# Patient Record
Sex: Female | Born: 1955 | Race: White | Hispanic: No | Marital: Single
Health system: Southern US, Community
[De-identification: ages and names within clinical notes are randomized; demographics above are authoritative.]

## PROBLEM LIST (undated history)

## (undated) DIAGNOSIS — J45909 Unspecified asthma, uncomplicated: Secondary | ICD-10-CM

## (undated) HISTORY — DX: Unspecified asthma, uncomplicated: J45.909

---

## 1999-01-28 ENCOUNTER — Ambulatory Visit (HOSPITAL_COMMUNITY): Admission: RE | Admit: 1999-01-28 | Discharge: 1999-01-28 | Payer: Self-pay | Admitting: *Deleted

## 1999-08-15 ENCOUNTER — Other Ambulatory Visit: Admission: RE | Admit: 1999-08-15 | Discharge: 1999-08-15 | Payer: Self-pay | Admitting: Family Medicine

## 1999-09-20 ENCOUNTER — Encounter: Payer: Self-pay | Admitting: Family Medicine

## 1999-09-20 ENCOUNTER — Encounter: Admission: RE | Admit: 1999-09-20 | Discharge: 1999-09-20 | Payer: Self-pay | Admitting: Family Medicine

## 2000-10-18 ENCOUNTER — Encounter: Admission: RE | Admit: 2000-10-18 | Discharge: 2000-10-18 | Payer: Self-pay | Admitting: Family Medicine

## 2000-10-18 ENCOUNTER — Encounter: Payer: Self-pay | Admitting: Family Medicine

## 2002-06-09 ENCOUNTER — Other Ambulatory Visit: Admission: RE | Admit: 2002-06-09 | Discharge: 2002-06-09 | Payer: Self-pay | Admitting: *Deleted

## 2002-12-23 ENCOUNTER — Encounter: Admission: RE | Admit: 2002-12-23 | Discharge: 2002-12-23 | Payer: Self-pay | Admitting: Family Medicine

## 2002-12-23 ENCOUNTER — Encounter: Payer: Self-pay | Admitting: Family Medicine

## 2004-08-31 ENCOUNTER — Other Ambulatory Visit: Admission: RE | Admit: 2004-08-31 | Discharge: 2004-08-31 | Payer: Self-pay | Admitting: Family Medicine

## 2005-09-21 ENCOUNTER — Other Ambulatory Visit: Admission: RE | Admit: 2005-09-21 | Discharge: 2005-09-21 | Payer: Self-pay | Admitting: Family Medicine

## 2005-09-28 ENCOUNTER — Encounter: Admission: RE | Admit: 2005-09-28 | Discharge: 2005-09-28 | Payer: Self-pay | Admitting: Family Medicine

## 2006-10-02 ENCOUNTER — Encounter: Admission: RE | Admit: 2006-10-02 | Discharge: 2006-10-02 | Payer: Self-pay | Admitting: Family Medicine

## 2007-09-30 ENCOUNTER — Encounter: Admission: RE | Admit: 2007-09-30 | Discharge: 2007-09-30 | Payer: Self-pay | Admitting: Family Medicine

## 2007-10-04 ENCOUNTER — Encounter: Admission: RE | Admit: 2007-10-04 | Discharge: 2007-10-04 | Payer: Self-pay | Admitting: Family Medicine

## 2008-10-07 ENCOUNTER — Encounter: Admission: RE | Admit: 2008-10-07 | Discharge: 2008-10-07 | Payer: Self-pay | Admitting: Family Medicine

## 2009-11-18 ENCOUNTER — Encounter: Admission: RE | Admit: 2009-11-18 | Discharge: 2009-11-18 | Payer: Self-pay | Admitting: Family Medicine

## 2010-11-18 ENCOUNTER — Other Ambulatory Visit: Payer: Self-pay | Admitting: Family Medicine

## 2010-11-18 ENCOUNTER — Ambulatory Visit
Admission: RE | Admit: 2010-11-18 | Discharge: 2010-11-18 | Disposition: A | Discharge status: Home / Self Care | Payer: 59 | Source: Ambulatory Visit | Attending: Family Medicine | Admitting: Family Medicine

## 2010-11-18 DIAGNOSIS — Z1231 Encounter for screening mammogram for malignant neoplasm of breast: Secondary | ICD-10-CM

## 2010-11-18 DIAGNOSIS — R059 Cough, unspecified: Secondary | ICD-10-CM

## 2010-11-18 DIAGNOSIS — R05 Cough: Secondary | ICD-10-CM

## 2010-11-24 ENCOUNTER — Ambulatory Visit
Admission: RE | Admit: 2010-11-24 | Discharge: 2010-11-24 | Disposition: A | Discharge status: Home / Self Care | Payer: 59 | Source: Ambulatory Visit | Attending: Family Medicine | Admitting: Family Medicine

## 2010-11-24 DIAGNOSIS — Z1231 Encounter for screening mammogram for malignant neoplasm of breast: Secondary | ICD-10-CM

## 2011-11-16 ENCOUNTER — Other Ambulatory Visit: Payer: Self-pay | Admitting: Family Medicine

## 2011-11-16 DIAGNOSIS — Z1231 Encounter for screening mammogram for malignant neoplasm of breast: Secondary | ICD-10-CM

## 2011-12-06 ENCOUNTER — Ambulatory Visit
Admission: RE | Admit: 2011-12-06 | Discharge: 2011-12-06 | Disposition: A | Discharge status: Home / Self Care | Payer: 59 | Source: Ambulatory Visit | Attending: Family Medicine | Admitting: Family Medicine

## 2011-12-06 DIAGNOSIS — Z1231 Encounter for screening mammogram for malignant neoplasm of breast: Secondary | ICD-10-CM

## 2012-11-19 ENCOUNTER — Other Ambulatory Visit: Payer: Self-pay

## 2012-11-19 DIAGNOSIS — Z1231 Encounter for screening mammogram for malignant neoplasm of breast: Secondary | ICD-10-CM

## 2012-12-19 ENCOUNTER — Ambulatory Visit
Admission: RE | Admit: 2012-12-19 | Discharge: 2012-12-19 | Disposition: A | Discharge status: Home / Self Care | Payer: 59 | Source: Ambulatory Visit

## 2012-12-19 DIAGNOSIS — Z1231 Encounter for screening mammogram for malignant neoplasm of breast: Secondary | ICD-10-CM

## 2013-10-21 ENCOUNTER — Other Ambulatory Visit: Payer: Self-pay

## 2013-10-21 DIAGNOSIS — Z1231 Encounter for screening mammogram for malignant neoplasm of breast: Secondary | ICD-10-CM

## 2013-12-24 ENCOUNTER — Ambulatory Visit
Admission: RE | Admit: 2013-12-24 | Discharge: 2013-12-24 | Disposition: A | Discharge status: Home / Self Care | Payer: 59 | Source: Ambulatory Visit

## 2013-12-24 DIAGNOSIS — Z1231 Encounter for screening mammogram for malignant neoplasm of breast: Secondary | ICD-10-CM

## 2014-11-24 ENCOUNTER — Other Ambulatory Visit: Payer: Self-pay

## 2014-11-24 DIAGNOSIS — Z1231 Encounter for screening mammogram for malignant neoplasm of breast: Secondary | ICD-10-CM

## 2014-12-30 ENCOUNTER — Ambulatory Visit
Admission: RE | Admit: 2014-12-30 | Discharge: 2014-12-30 | Disposition: A | Discharge status: Home / Self Care | Payer: 59 | Source: Ambulatory Visit

## 2014-12-30 DIAGNOSIS — Z1231 Encounter for screening mammogram for malignant neoplasm of breast: Secondary | ICD-10-CM

## 2015-04-21 ENCOUNTER — Institutional Professional Consult (permissible substitution): Payer: 59 | Admitting: Pulmonary Disease

## 2015-05-19 ENCOUNTER — Encounter (INDEPENDENT_AMBULATORY_CARE_PROVIDER_SITE_OTHER): Payer: Self-pay

## 2015-05-19 ENCOUNTER — Encounter: Payer: Self-pay | Admitting: Pulmonary Disease

## 2015-05-19 ENCOUNTER — Ambulatory Visit (INDEPENDENT_AMBULATORY_CARE_PROVIDER_SITE_OTHER)
Admission: RE | Admit: 2015-05-19 | Discharge: 2015-05-19 | Disposition: A | Discharge status: Home / Self Care | Payer: 59 | Source: Ambulatory Visit | Attending: Pulmonary Disease | Admitting: Pulmonary Disease

## 2015-05-19 ENCOUNTER — Ambulatory Visit (INDEPENDENT_AMBULATORY_CARE_PROVIDER_SITE_OTHER): Payer: 59 | Admitting: Pulmonary Disease

## 2015-05-19 VITALS — BP 106/64 | HR 82 | Temp 97.2°F | Ht 66.0 in | Wt 122.6 lb

## 2015-05-19 DIAGNOSIS — J4530 Mild persistent asthma, uncomplicated: Secondary | ICD-10-CM

## 2015-05-19 DIAGNOSIS — M419 Scoliosis, unspecified: Secondary | ICD-10-CM

## 2015-05-19 DIAGNOSIS — R0600 Dyspnea, unspecified: Secondary | ICD-10-CM | POA: Diagnosis not present

## 2015-05-19 DIAGNOSIS — J45909 Unspecified asthma, uncomplicated: Secondary | ICD-10-CM | POA: Insufficient documentation

## 2015-05-19 MED ORDER — CLONAZEPAM 0.5 MG PO TABS: ORAL_TABLET | ORAL | Status: DC

## 2015-05-19 NOTE — Progress Notes (Addendum)
Subjective:     Patient ID: Wendy [name], female   DOB: [birth year], 59 y.o.   MRN: [id number]  HPI 59 y/o WF, referred by Dr. Kelton Pillar, Sadie Haber at [facility], for a pulmonary evaluation due to chest tightness and a hx of asthma>      Wendy [name] tells me that she has had Asthma ever since she was about 59 y/o when she had a bout of double pneumonia & told she had Asthma after that; as a child she recalls ER visits and shots w/ one other hosp around age 39 due to an asthma attack; she never used any inhalers until her teenage yrs when she started using OTC Primateen... In her adult life she notes occas episodes of bronchitis requiring antibiotics, and was started on Advair100 approx 10 yrs ago- initially used just one inhalation daily & recently incr to one inhalation Bid due to increased difficulty breathing & having to use her rescue inhaler (Proair) more often... She describes chest tightness ans some intermittent right chest discomfort but she actually denies wheezing, cough, sputum, hemoptysis, f/c/s, sinus symptoms, reflux, etc; her sensation of SOB is hard for her to characterize but she feels that it's more difficult to get the air "IN", not satisfied breathing, "not enough room for the air in her lungs"; she is active & walks ~57min most days, denies DOE, does some light yard work but [employer], denies any progressive DOE...       She is a never smoker but was exposed to second hand smoke from her parents- Father died from small cell ca, and Mother has COPD & CHF; she believes that her paternal grandfather had silicosis (she says he was a well digger); she is employed in the IT Dept at [employer] denies any known occupational exposures over he lifetime...      She has a scratchy voice which she says occurs most mornings but denies sore throat, reflux symptoms, cough at night, hx of HH etc; she sleeps well, wakes refreshed, but enjoys an afternoon nap & gets tired by 8PM; she denies being told about  snoring, restless sleeping, etc; she denies any bad allergies and has a dog (scottish terrier) and 2 cats...  EXAM reveals Afeb, VSS, O2sat=95% on RA;  HEENT- neg, mallampati1;  Chest- clear w/o w/r/r;  Heart- RR w/o m/r/g;  Abd- soft, nontender, neg;  Ext- neg w/o c/c/e;  Neuro- intact...  CXR 05/19/15 showed norm heart size, clear lungs, scoliosis, NAD....   Spirometry 05/19/15 showed FVC=3.3 (94%), FEV1=2.6 (92%), %1sec=76, mid-flows are wnl at 89% of predicted...  CT Chest w/o contrast ordered & pending IMP/PLAN>>  [name] has a lifelong hx of asthma but despite her intermittent difficulties she appears to have been well controlled and currently her CXR & Spirometry are WNL;  Her complaints of dyspnea & chest discomfort sounds to me to be reminiscent of "chest wall musc spasm" and we spent some time discussing this in the office today & I gave her a lot of reassurance;  In this regard- I have asked her to continue her Advair100Bid and Proair rescue inhaler prn, plus we are going to give her a trial of a "combination relaxer" like KLONOPIN 0.5mg - 1/2 to 1 tab Bid regularly over the next month;  With her family hx we are going to check a CT Chest for completeness, & we plan a brief recheck in about a month but she knows she may call upon me for any breathing issues in the  interim...  ADDENDUM>>  CT Chest 05/26/15 showed mild bilat apical pleuroparenchymal scarring w/ 60mm left apical nodule (likely scar tissue), no adenopathy; incidental findings include: coronary calcif & poss gallstone-  I called pt w/ report... SMN      Past Medical History  Diagnosis Date  . Asthma    Hx UTIs    Scoliosis    Hx Vit D defic >> on Vit D 2000u daily and calcium + MVI    Hx anxiety & depression >> on Alprazolam 0.25 prn & EffexorXR75    Hx basal cell skin cancer     No past surgical history on file. Removal of basal cell cancer from right clavicular area 2015   Outpatient Encounter Prescriptions as of  05/19/2015  Medication Sig  . ADVAIR DISKUS 100-50 MCG/DOSE AEPB Take 1 puff by mouth 2 (two) times daily.  Marland Kitchen ALPRAZolam (XANAX) 0.25 MG tablet Take 0.25 mg by mouth daily as needed for anxiety.  . beta carotene w/minerals (OCUVITE) tablet Take 1 tablet by mouth daily.  . Calcium-Magnesium 300-300 MG TABS Take 1 tablet by mouth daily.  . cholecalciferol (VITAMIN D) 1000 UNITS tablet Take 2,000 Units by mouth daily.  Marland Kitchen PROAIR HFA 108 (90 BASE) MCG/ACT inhaler Take 2 puffs by mouth every 4 (four) hours as needed.  . venlafaxine XR (EFFEXOR-XR) 75 MG 24 hr capsule 75 mg daily.    Allergies  Allergen Reactions  . Celexa [Citalopram] Anxiety    Family History  Problem Relation Age of Onset  . Heart disease Mother   . Heart disease Maternal Grandmother   . Lung cancer Father   . COPD Mother     Social History   Social History  . Marital Status: Single    Spouse Name: N/A  . Number of Children: N/A  . Years of Education: N/A   Occupational History  . Not on file.   Social History Main Topics  . Smoking status: Passive Smoke Exposure - Never Smoker  . Smokeless tobacco: Not on file  . Alcohol Use: Not on file  . Drug Use: Not on file  . Sexual Activity: Not on file   Other Topics Concern  . Not on file   Social History Narrative  . No narrative on file    Current Medications, Allergies, Past Medical History, Past Surgical History, Family History, and Social History were reviewed in [employer] record.   Review of Systems             All symptoms NEG except where BOLDED >>  Constitutional:  F/C/S, fatigue, anorexia, unexpected weight change. HEENT:  HA, visual changes, hearing loss, earache, nasal symptoms, sore throat, mouth sores, hoarseness. Resp:  cough, sputum, hemoptysis; SOB, tightness, wheezing. Cardio:  CP, palpit, DOE, orthopnea, edema. GI:  N/V/D/C, blood in stool; reflux, abd pain, distention, gas. GU:  dysuria, freq, urgency,  hematuria, flank pain, voiding difficulty. MS:  joint pain, swelling, tenderness, decr ROM; neck pain, back pain, etc. Neuro:  HA, tremors, seizures, dizziness, syncope, weakness, numbness, gait abn. Skin:  suspicious lesions or skin rash. Heme:  adenopathy, bruising, bleeding. Psyche:  confusion, agitation, sleep disturbance, hallucinations, anxiety, depression suicidal.   Objective:   Physical Exam       Vital Signs:  Reviewed...  General:  WD, WN, 59 y/o WF in NAD; alert & oriented; pleasant & cooperative... HEENT:  [place]/AT; Conjunctiva- pink, Sclera- nonicteric, EOM-wnl, PERRLA, EACs-clear, TMs-wnl; NOSE-clear; THROAT-clear & wnl. Neck:  Supple w/ full ROM;  no JVD; normal carotid impulses w/o bruits; no thyromegaly or nodules palpated; no lymphadenopathy. Chest:  Clear to P & A; without wheezes, rales, or rhonchi heard. Heart:  Regular Rhythm; norm S1 & S2 without murmurs, rubs, or gallops detected. Abdomen:  Soft & nontender- no guarding or rebound; normal bowel sounds; no organomegaly or masses palpated. Ext:  Normal ROM; without deformities or arthritic changes; no varicose veins, venous insuffic, or edema;  Pulses intact w/o bruits. Neuro:  No focal neuro deficits; sensory testing normal; gait normal & balance OK. Derm:  No lesions noted; no rash etc. Lymph:  No cervical, supraclavicular, axillary, or inguinal adenopathy palpated.   Assessment:      IMP >>     Dyspnea, chest tightness, & chest discomfort> I suspect a component from "chest wall musc spasm" to explain her symptomatology...    Hx ASTHMA> she describes lifelong hx asthma, mild intermittent in the past w/ preserved PFTs w/o airflow obstruction...    Scoliosis> visible on her CXRs...   PLAN >>  [place] has a lifelong hx of asthma but despite her intermittent difficulties she appears to have been well controlled and currently her CXR & Spirometry are WNL;  Her complaints of dyspnea & chest discomfort sounds to me to be  reminiscent of "chest wall musc spasm" and we spent some time discussing this in the office today & I gave her a lot of reassurance;  In this regard- I have asked her to continue her Advair100Bid and Proair rescue inhaler prn, plus we are going to give her a trial of a "combination relaxer" like KLONOPIN 0.5mg - 1/2 to 1 tab Bid regularly over the next month;  With her family hx we are going to check a CT Chest for completeness, & we plan a brief recheck in about a month but she knows she may call upon me for any breathing issues in the interim     Plan:     Patient's Medications  New Prescriptions   CLONAZEPAM (KLONOPIN) 0.5 MG TABLET    Take 1/2 to 1 tablet twice daily  Previous Medications   ADVAIR DISKUS 100-50 MCG/DOSE AEPB    Take 1 puff by mouth 2 (two) times daily.   ALPRAZOLAM (XANAX) 0.25 MG TABLET    Take 0.25 mg by mouth daily as needed for anxiety.   BETA CAROTENE W/MINERALS (OCUVITE) TABLET    Take 1 tablet by mouth daily.   CALCIUM-MAGNESIUM 300-300 MG TABS    Take 1 tablet by mouth daily.   CHOLECALCIFEROL (VITAMIN D) 1000 UNITS TABLET    Take 2,000 Units by mouth daily.   PROAIR HFA 108 (90 BASE) MCG/ACT INHALER    Take 2 puffs by mouth every 4 (four) hours as needed.   VENLAFAXINE XR (EFFEXOR-XR) 75 MG 24 HR CAPSULE    75 mg daily.  Modified Medications   No medications on file  Discontinued Medications   No medications on file

## 2015-05-19 NOTE — Patient Instructions (Signed)
Carron, it was nice meeting you today...  We discussed getting a thorough lung assessment w/ a CXR, Pulm function test, and CT Chest as well...    We will contact you w/ the results when available...   Today we decided to try a combination relaxer to aide with the "chest wall muscle spasm" component of your current symptoms...    In this regard-- start the new KLONOPIN 0.5mg - 1/2 to 1 tab twice daily on a regular dosing schedule...  Continue your Advair & Proair rescue inhaler as needed...  Call for any questions...  Let's plan a follow up visit in 4-6weeks, sooner if needed for ant breathing problems.Marland KitchenMarland Kitchen

## 2015-05-26 ENCOUNTER — Ambulatory Visit (INDEPENDENT_AMBULATORY_CARE_PROVIDER_SITE_OTHER)
Admission: RE | Admit: 2015-05-26 | Discharge: 2015-05-26 | Disposition: A | Discharge status: Home / Self Care | Payer: 59 | Source: Ambulatory Visit | Attending: Pulmonary Disease | Admitting: Pulmonary Disease

## 2015-05-26 DIAGNOSIS — R0600 Dyspnea, unspecified: Secondary | ICD-10-CM | POA: Diagnosis not present

## 2015-05-31 ENCOUNTER — Telehealth: Payer: Self-pay | Admitting: Pulmonary Disease

## 2015-05-31 ENCOUNTER — Encounter (INDEPENDENT_AMBULATORY_CARE_PROVIDER_SITE_OTHER): Payer: Self-pay

## 2015-05-31 NOTE — Telephone Encounter (Signed)
Called and spoke with pt Pt stated that Dr Lenna Gilford called her last week with the results of her CT chest Pt is requesting that results be sent to her PCP, Kelton Pillar, to see if she needs to f/u with cardiologist Informed pt that i would sent results to her PCP  Results sent electronically to PCP  Nothing further is needed

## 2015-06-16 ENCOUNTER — Ambulatory Visit: Payer: 59 | Admitting: Pulmonary Disease

## 2015-06-24 ENCOUNTER — Ambulatory Visit: Payer: 59 | Admitting: Pulmonary Disease

## 2015-07-07 ENCOUNTER — Ambulatory Visit: Payer: 59 | Admitting: Pulmonary Disease

## 2015-07-14 ENCOUNTER — Ambulatory Visit (INDEPENDENT_AMBULATORY_CARE_PROVIDER_SITE_OTHER): Payer: 59 | Admitting: Pulmonary Disease

## 2015-07-14 ENCOUNTER — Encounter: Payer: Self-pay | Admitting: Pulmonary Disease

## 2015-07-14 VITALS — BP 102/68 | HR 78 | Temp 98.5°F | Wt 135.8 lb

## 2015-07-14 DIAGNOSIS — J4520 Mild intermittent asthma, uncomplicated: Secondary | ICD-10-CM

## 2015-07-14 DIAGNOSIS — R0600 Dyspnea, unspecified: Secondary | ICD-10-CM

## 2015-07-14 DIAGNOSIS — R911 Solitary pulmonary nodule: Secondary | ICD-10-CM | POA: Insufficient documentation

## 2015-07-14 NOTE — Patient Instructions (Signed)
Today we updated your med list in our EPIC system...    Continue your current medications the same...  Call for any questions...  Let's plan a follow up visit in one year for a recheck & to consider a follow up CT scan.Marland KitchenMarland KitchenMarland Kitchen

## 2015-12-27 ENCOUNTER — Other Ambulatory Visit: Payer: Self-pay

## 2015-12-27 DIAGNOSIS — Z1231 Encounter for screening mammogram for malignant neoplasm of breast: Secondary | ICD-10-CM

## 2016-01-13 ENCOUNTER — Ambulatory Visit
Admission: RE | Admit: 2016-01-13 | Discharge: 2016-01-13 | Disposition: A | Discharge status: Home / Self Care | Payer: 59 | Source: Ambulatory Visit

## 2016-01-13 DIAGNOSIS — Z1231 Encounter for screening mammogram for malignant neoplasm of breast: Secondary | ICD-10-CM

## 2016-07-13 ENCOUNTER — Ambulatory Visit: Payer: 59 | Admitting: Pulmonary Disease

## 2016-07-14 NOTE — Progress Notes (Signed)
Subjective:     Patient ID: Wendy [name], female   DOB: 1955-12-13, 60 y.o.   MRN: [id number]  HPI ~  May 19, 2015:  Initial Pulmonary Consult by SN>>   30 y/o [name], referred by Dr. Kelton [name], Wendy [name] at [facility], for a pulmonary evaluation due to chest tightness and a hx of asthma>      Wendy [name] tells me that she has had Asthma ever since she was about 60 y/o when she had a bout of double pneumonia & told she had Asthma after that; as a child she recalls ER visits and shots w/ one other hosp around age 7 due to an asthma attack; she never used any inhalers until her teenage yrs when she started using OTC Primateen... In her adult life she notes occas episodes of bronchitis requiring antibiotics, and was started on Advair100 approx 10 yrs ago- initially used just one inhalation daily & recently incr to one inhalation Bid due to increased difficulty breathing & having to use her rescue inhaler (Proair) more often... She describes chest tightness ans some intermittent right chest discomfort but she actually denies wheezing, cough, sputum, hemoptysis, f/c/s, sinus symptoms, reflux, etc; her sensation of SOB is hard for her to characterize but she feels that it's more difficult to get the air "IN", not satisfied breathing, "not enough room for the air in her lungs"; she is active & walks ~63min most days, denies DOE, does some light yard work but [name], denies any progressive DOE...       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      She has a scratchy voice which she says occurs most mornings but denies sore throat, reflux symptoms, cough at night, hx of HH etc; she sleeps well, wakes refreshed, but enjoys an afternoon  nap & gets tired by 8PM; she denies being told about snoring, restless sleeping, etc; she denies any bad allergies and has a dog (scottish terrier) and 2 cats...  EXAM reveals Afeb, VSS, O2sat=95% on RA;  HEENT- neg, mallampati1;  Chest- clear w/o w/r/r;  Heart- RR w/o m/r/g;  Abd- soft, nontender, neg;  Ext- neg w/o c/c/e;  Neuro- intact...  CXR 05/19/15 showed norm heart size, clear lungs, scoliosis, NAD....   Spirometry 05/19/15 showed FVC=3.3 (94%), FEV1=2.6 (92%), %1sec=76, mid-flows are wnl at 89% of predicted...  CT Chest w/o contrast ordered & pending IMP/PLAN>>  Wendy [name] has a lifelong hx of asthma but despite her intermittent difficulties she appears to have been well controlled and currently her CXR & Spirometry are WNL;  Her complaints of dyspnea & chest discomfort sounds to me to be reminiscent of "chest wall musc spasm" and we spent some time discussing this in the office today & I gave her a lot of reassurance;  In this regard- I have asked her to continue her Advair100Bid and Proair rescue inhaler prn, plus we are going to give her a trial of a "combination relaxer" like KLONOPIN 0.5mg - 1/2 to 1 tab Bid regularly over the next month;  With her family hx we are going to check a CT Chest for completeness, & we plan a brief recheck in about a month but  she knows she may call upon me for any breathing issues in the interim...  ADDENDUM>>  CT Chest 05/26/15 showed mild bilat apical pleuroparenchymal scarring w/ 66mm left apical nodule (likely scar tissue), no adenopathy; incidental findings include: coronary calcif & poss gallstone-  I called pt w/ report... SMN    ~  July 14, 2015:  20mo ROV w/ SN>  Wendy [name] reports that she filled the presciption for the Klonopin but decided NOT to take it; notes that her breathing is about the same on her Advair100-Bid & Proair rescue inhaler prn;  In the interim she tells me that she had a CARDIAC eval by [facility] (?due to calcif seen on CT  Chest?) but we do not have any access to his notes or eval and she is asked to get notes sent to [place] to scan into Epic...  We reviewed the following medical problems during today's office visit >>     Dyspnea>  This is multifactorial & we discussed trial :combination relaxer" like KLONOPIN 0.5mg  Bid but she decided not to take this med; asked to try her shorter acting Alpraz prn to see if there is some improvement...     Hx lifelong asthma>  Mild intermittent uncomplicated w/ baseline CXR clear & wnl, plus baseline Spirometry wnl; under control w/ Advair100Bid & ProairHFA rescue inhaler prn...    Abn CT Chest 05/2015 w/ mild bilat apical pleuroparenchymal scarring & 8mm LUL nodule that needs f/u 1yr    Medical issues> Coronary calcif seen on CT Chest- takes ASA325 daily; Scoliosis seen on CXR; Hyperlipidemia- started on Lip20; Vit D defic on VitD supplement ~2000u daily; Hx anxiety & depression on Alpraz0.25 & EffexorXR75 EXAM reveals Afeb, VSS, O2sat=98% on RA;  HEENT- neg, mallampati1;  Chest- clear w/o w/r/r;  Heart- RR w/o m/r/g;  Abd- soft, nontender, neg;  Ext- neg w/o c/c/e;  Neuro- intact... IMP/PLAN>>  We discussed the pros and cons of taking the Klonopin; in any event her dyspnea is mild and intermittent- she will continue on the Advair100Bid & use the Proair & Alpraz prn;  REC to f/u w/ me in 68yr for recheck and f/u CT Chest to check the nodule...     Past Medical History  Diagnosis Date  . Asthma    Hx UTIs    Scoliosis    Hx Vit D defic >> on Vit D 2000u daily and calcium + MVI    Hx anxiety & depression >> on Alprazolam 0.25 prn & EffexorXR75    Coronary calcif seen on CT Chest>> she had [facility] eval by [name]    Hyperlipidemia >> started on ASA 325mg  & Lipitor 20mg  by [facility]...     No past surgical history on file. Removal of basal cell cancer from right clavicular area 2015   Outpatient Encounter Prescriptions as of 05/19/2015  Medication Sig  . ADVAIR DISKUS  100-50 MCG/DOSE AEPB Take 1 puff by mouth 2 (two) times daily.  Marland Kitchen ALPRAZolam (XANAX) 0.25 MG tablet Take 0.25 mg by mouth daily as needed for anxiety.  . beta carotene w/minerals (OCUVITE) tablet Take 1 tablet by mouth daily.  . Calcium-Magnesium 300-300 MG TABS Take 1 tablet by mouth daily.  . cholecalciferol (VITAMIN D) 1000 UNITS tablet Take 2,000 Units by mouth daily.  Marland Kitchen PROAIR HFA 108 (90 BASE) MCG/ACT inhaler Take 2 puffs by mouth every 4 (four) hours as needed.  . venlafaxine XR (EFFEXOR-XR) 75 MG 24 hr capsule 75 mg daily.  Allergies  Allergen Reactions  . Celexa [Citalopram] Anxiety    Family History  Problem Relation Age of Onset  . Heart disease Mother   . Heart disease Maternal Grandmother   . Lung cancer Father   . COPD Mother     Social History   Social History  . Marital status: Single    Spouse name: N/A  . Number of children: N/A  . Years of education: N/A   Occupational History  . Not on file.   Social History Main Topics  . Smoking status: Passive Smoke Exposure - Never Smoker  . Smokeless tobacco: Not on file  . Alcohol use Not on file  . Drug use: Unknown  . Sexual activity: Not on file   Other Topics Concern  . Not on file   Social History Narrative  . No narrative on file    Current Medications, Allergies, Past Medical History, Past Surgical History, Family History, and Social History were reviewed in [employer] record.   Review of Systems              All symptoms NEG except where BOLDED >>  Constitutional:  F/C/S, fatigue, anorexia, unexpected weight change. HEENT:  HA, visual changes, hearing loss, earache, nasal symptoms, sore throat, mouth sores, hoarseness. Resp:  cough, sputum, hemoptysis; SOB, tightness, wheezing. Cardio:  CP, palpit, DOE, orthopnea, edema. GI:  N/V/D/C, blood in stool; reflux, abd pain, distention, gas. GU:  dysuria, freq, urgency, hematuria, flank pain, voiding difficulty. MS:   joint pain, swelling, tenderness, decr ROM; neck pain, back pain, etc. Neuro:  HA, tremors, seizures, dizziness, syncope, weakness, numbness, gait abn. Skin:  suspicious lesions or skin rash. Heme:  adenopathy, bruising, bleeding. Psyche:  confusion, agitation, sleep disturbance, hallucinations, anxiety, depression suicidal.   Objective:   Physical Exam        Vital Signs:  Reviewed...   General:  WD, WN, Wendy [name] in NAD; alert & oriented; pleasant & cooperative... HEENT:  [place]/AT; Conjunctiva- pink, Sclera- nonicteric, EOM-wnl, PERRLA, EACs-clear, TMs-wnl; NOSE-clear; THROAT-clear & wnl.  Neck:  Supple w/ full ROM; no JVD; normal carotid impulses w/o bruits; no thyromegaly or nodules palpated; no lymphadenopathy.  Chest:  Clear to P & A; without wheezes, rales, or rhonchi heard. Heart:  Regular Rhythm; norm S1 & S2 without murmurs, rubs, or gallops detected. Abdomen:  Soft & nontender- no guarding or rebound; normal bowel sounds; no organomegaly or masses palpated. Ext:  Normal ROM; without deformities or arthritic changes; no varicose veins, venous insuffic, or edema;  Pulses intact w/o bruits. Neuro:  No focal neuro deficits; sensory testing normal; gait normal & balance OK. Derm:  No lesions noted; no rash etc. Lymph:  No cervical, supraclavicular, axillary, or inguinal adenopathy palpated.   Assessment:      IMP >>     Dyspnea, chest tightness, & chest discomfort> I suspect a component from "chest wall musc spasm" to explain her symptomatology; this is multifactorial & we discussed trial :combination relaxer" like KLONOPIN 0.5mg  Bid but she decided not to take this med; asked to try her shorter acting Alpraz prn to see if there is some improvement...     Hx lifelong asthma>  Mild intermittent uncomplicated w/ baseline CXR clear & wnl, plus baseline Spirometry wnl; under control w/ Advair100Bid & ProairHFA rescue inhaler prn...    Abn CT Chest 05/2015 w/ mild bilat apical  pleuroparenchymal scarring & 58mm LUL nodule that needs f/u 63yr    Medical issues>  Coronary calcif seen on CT Chest- takes ASA325 daily; Scoliosis seen on CXR; Hyperlipidemia- started on Lip20; Vit D defic on VitD supplement ~2000u daily; Hx anxiety & depression on Alpraz0.25 & EffexorXR75  PLAN >>  05/19/15>   Wendy [name] has a lifelong hx of asthma but despite her intermittent difficulties she appears to have been well controlled and currently her CXR & Spirometry are WNL;  Her complaints of dyspnea & chest discomfort sounds to me to be reminiscent of "chest wall musc spasm" and we spent some time discussing this in the office today & I gave her a lot of reassurance;  In this regard- I have asked her to continue her Advair100Bid and Proair rescue inhaler prn, plus we are going to give her a trial of a "combination relaxer" like KLONOPIN 0.5mg - 1/2 to 1 tab Bid regularly over the next month;  With her family hx we are going to check a CT Chest for completeness, & we plan a brief recheck in about a month but she knows she may call upon me for any breathing issues in the interim 07/14/15>   We discussed the pros and cons of taking the Klonopin; in any event her dyspnea is mild and intermittent- she will continue on the Advair100Bid & use the Proair & Alpraz prn;  REC to f/u w/ me in 70yr for recheck and f/u CT Chest to check the nodule..     Plan:     Patient's Medications  New Prescriptions   No medications on file  Previous Medications   ADVAIR DISKUS 100-50 MCG/DOSE AEPB    Take 1 puff by mouth 2 (two) times daily.   ALPRAZOLAM (XANAX) 0.25 MG TABLET    Take 0.25 mg by mouth daily as needed for anxiety.   ATORVASTATIN (LIPITOR) 20 MG TABLET    Take 20 mg by mouth daily.   BETA CAROTENE W/MINERALS (OCUVITE) TABLET    Take 1 tablet by mouth daily.   CALCIUM-MAGNESIUM 300-300 MG TABS    Take 1 tablet by mouth daily.   CHOLECALCIFEROL (VITAMIN D) 1000 UNITS TABLET    Take 2,000 Units by mouth daily.    CLONAZEPAM (KLONOPIN) 0.5 MG TABLET    Take 1/2 to 1 tablet twice daily   PROAIR HFA 108 (90 BASE) MCG/ACT INHALER    Take 2 puffs by mouth every 4 (four) hours as needed.   VENLAFAXINE XR (EFFEXOR-XR) 75 MG 24 HR CAPSULE    75 mg daily.  Modified Medications   No medications on file  Discontinued Medications   No medications on file

## 2016-08-23 ENCOUNTER — Encounter: Payer: Self-pay | Admitting: Pulmonary Disease

## 2016-08-23 ENCOUNTER — Ambulatory Visit (INDEPENDENT_AMBULATORY_CARE_PROVIDER_SITE_OTHER): Payer: 59 | Admitting: Pulmonary Disease

## 2016-08-23 VITALS — BP 110/64 | HR 77 | Temp 98.1°F | Ht 66.0 in | Wt 157.4 lb

## 2016-08-23 DIAGNOSIS — J4520 Mild intermittent asthma, uncomplicated: Secondary | ICD-10-CM | POA: Diagnosis not present

## 2016-08-23 DIAGNOSIS — R911 Solitary pulmonary nodule: Secondary | ICD-10-CM

## 2016-08-23 DIAGNOSIS — M419 Scoliosis, unspecified: Secondary | ICD-10-CM

## 2016-08-23 NOTE — Patient Instructions (Signed)
Today we updated your med list in our EPIC system...    Continue your current medications the same...  We will arrange for a follow up CT Chest to recheck your lungs and the small LUL nodule previously identified...    We will contact you w/ the results when available...   Keep up the good work w/ your exercise program Wendy [name] thanks you too)...  Call for any questions...  Let's plan a follow up visit in 36yr, sooner if needed for problems.Marland KitchenMarland Kitchen

## 2016-08-23 NOTE — Progress Notes (Signed)
Subjective:     Patient ID: Wendy [name], female   DOB: 07-Jan-1956, 60 y.o.   MRN: [id number]  HPI ~  May 19, 2015:  Initial Pulmonary Consult by SN>>   60 y/o WF, referred by Dr. Kelton Pillar, Sadie Haber at [facility], for a pulmonary evaluation due to chest tightness and a hx of asthma>      Halah tells me that she has had Asthma ever since she was about 60 y/o when she had a bout of double pneumonia & told she had Asthma after that; as a child she recalls ER visits and shots w/ one other hosp around age 10 due to an asthma attack; she never used any inhalers until her teenage yrs when she started using OTC Primateen... In her adult life she notes occas episodes of bronchitis requiring antibiotics, and was started on Advair100 approx 10 yrs ago- initially used just one inhalation daily & recently incr to one inhalation Bid due to increased difficulty breathing & having to use her rescue inhaler (Proair) more often... She describes chest tightness ans some intermittent right chest discomfort but she actually denies wheezing, cough, sputum, hemoptysis, f/c/s, sinus symptoms, reflux, etc; her sensation of SOB is hard for her to characterize but she feels that it's more difficult to get the air "IN", not satisfied breathing, "not enough room for the air in her lungs"; she is active & walks ~70mn most days, denies DOE, does some light yard work but [employer] denies any progressive DOE...       She is a never smoker but was exposed to second hand smoke from her parents- Father died from small cell ca, and Mother has COPD & CHF; she believes that her paternal grandfather had silicosis (she says he was a well digger); she is employed in the IT Dept at [place] any known occupational exposures over he lifetime...      She has a scratchy voice which she says occurs most mornings but denies sore throat, reflux symptoms, cough at night, hx of HH etc; she sleeps well, wakes refreshed, but enjoys an afternoon  nap & gets tired by 8PM; she denies being told about snoring, restless sleeping, etc; she denies any bad allergies and has a dog (scottish terrier) and 2 cats...      EXAM reveals Afeb, VSS, O2sat=95% on RA;  HEENT- neg, mallampati1;  Chest- clear w/o w/r/r;  Heart- RR w/o m/r/g;  Abd- soft, nontender, neg;  Ext- neg w/o c/c/e;  Neuro- intact...  CXR 05/19/15 showed norm heart size, clear lungs, scoliosis, NAD....   Spirometry 05/19/15 showed FVC=3.3 (94%), FEV1=2.6 (92%), %1sec=76, mid-flows are wnl at 89% of predicted...  CT Chest w/o contrast ordered & pending IMP/PLAN>>  RCassiahas a lifelong hx of asthma but despite her intermittent difficulties she appears to have been well controlled and currently her CXR & Spirometry are WNL;  Her complaints of dyspnea & chest discomfort sounds to me to be reminiscent of "chest wall musc spasm" and we spent some time discussing this in the office today & I gave her a lot of reassurance;  In this regard- I have asked her to continue her Advair100Bid and Proair rescue inhaler prn, plus we are going to give her a trial of a "combination relaxer" like KLONOPIN 0.'5mg'$ - 1/2 to 1 tab Bid regularly over the next month;  With her family hx we are going to check a CT Chest for completeness, & we plan a brief recheck in  about a month but she knows she may call upon me for any breathing issues in the interim...  ADDENDUM>>  CT Chest 05/26/15 showed mild bilat apical pleuroparenchymal scarring w/ 80m left apical nodule (likely scar tissue), no adenopathy; incidental findings include: coronary calcif & poss gallstone-  I called pt w/ report... SMN    ~  July 14, 2015:  3060moOV w/ SN>  RoJazzyeports that she filled the presciption for the Klonopin but decided NOT to take it; notes that her breathing is about the same on her Advair100-Bid & Proair rescue inhaler prn;  In the interim she tells me that [facility] (?due to calcif seen on CT  Chest?) but we do not have any access to his notes or eval and she is asked to get notes sent to usKoreao scan into Epic...  We reviewed the following medical problems during today's office visit >>     Dyspnea>  This is multifactorial & we discussed trial :combination relaxer" like KLONOPIN 0.'5mg'$  Bid but she decided not to take this med; asked to try her shorter acting Alpraz prn to see if there is some improvement...     Hx lifelong asthma>  Mild intermittent uncomplicated w/ baseline CXR clear & wnl, plus baseline Spirometry wnl; under control w/ Advair100Bid & ProairHFA rescue inhaler prn...    Abn CT Chest 05/2015 w/ mild bilat apical pleuroparenchymal scarring & 36m50mUL nodule that needs f/u 12yr55yrMedical issues> Coronary calcif seen on CT Chest- takes ASA325 daily; Scoliosis seen on CXR; Hyperlipidemia- started on Lip20; Vit D defic on VitD supplement ~2000u daily; Hx anxiety & depression on Alpraz0.25 & EffexorXR75 EXAM reveals Afeb, VSS, O2sat=98% on RA;  HEENT- neg, mallampati1;  Chest- clear w/o w/r/r;  Heart- RR w/o m/r/g;  Abd- soft, nontender, neg;  Ext- neg w/o c/c/e;  Neuro- intact... IMP/PLAN>>  We discussed the pros and cons of taking the Klonopin; in any event her dyspnea is mild and intermittent- she will continue on the Advair100Bid & use the Proair & Alpraz prn;  REC to f/u w/ me in 12yr 60yrrecheck and f/u CT Chest to check the nodule...   ~  August 23, 2016:  60mo 48mo/ SN>  Yashvi Tyians feeling great and denies any cardio-resp symptoms- no cough, sput, hemoptysis, CP, SOB, wheezing/ tightness, etc;  She denies CP/ angina, palpit, dizzy, edema, etc; no f/c/s...  She remains on Advair100bid & Proair rescue (hasn't needed) but she tells me that her organic black elderberry syrup has really helped her resp issues... We reviewed the following medical problems during today's office visit>     Dyspnea>  This is multifactorial & we discussed trial :combination relaxer" like KLONOPIN 0.'5mg'$   Bid but she preferred to use her Xanax 0.25 prn...    Hx lifelong asthma>  Mild intermittent uncomplicated w/ baseline CXR clear & wnl, plus baseline Spirometry wnl; under control w/ Advair100Bid & ProairHFA rescue inhaler prn...    Abn CT Chest 05/2015 w/ mild bilat apical pleuroparenchymal scarring & 36mm LU76module that needs f/u CT Chest now...    Medical issues> Coronary calcif seen on CT Chest- takes ASA325 daily; Scoliosis seen on CXR; Hyperlipidemia- started on Lip20; Vit D defic on VitD supplement ~2000u daily; Hx anxiety & depression on Alpraz0.25 & EffexorXR75 EXAM reveals Afeb, VSS, O2sat=94% on RA;  HEENT- neg, mallampati1;  Chest- clear w/o w/r/r;  Heart- RR w/o m/r/g;  Abd- soft, nontender, neg;  Ext- neg w/o c/c/e;  Neuro- intact...  CT Chest 08/24/16>  Norm heart size w/ coronary calcif seen; no enlarged mediastinal nodes; left apical nodule measures 5.5x4.50m (sl larger) but it is unchanged at 5x644mon the coronal images; mild apical scarring, no new lesions etc; Radiology feels that the diff could be from slice selection & they rec f/u CT in 6 months... Pt notified & she will ret in 53m50mor recheck.  IMP/PLAN>>  RobNarelle clinically stable and asymptomatic- her asthma controlled on the Advair100 + rescue inhaler prn (hasn't needed), her dyspnea improved as well- she remains active & has the Alprazolam to use prn; finally her CT Chest is similar in appearance to prev w/ tiny ~5mm11mL nodule that we are following, Radiology hedged their bet w/ min enlargement felt to be ?real vs slice selection=> they rec continued f/u CT scans...    Past Medical History  Diagnosis Date  . Asthma    Hx UTIs    Scoliosis    Hx Vit D defic >> on Vit D 2000u daily and calcium + MVI    Hx anxiety & depression >> on Alprazolam 0.25 prn & EffexorXR75    Coronary calcif seen on CT Chest>> [facility]    Hyperlipidemia >> started on ASA '325mg'$  & Lipitor '20mg'$  by [facility]...       No past surgical history on file. Removal of basal cell cancer from right clavicular area 2015   Outpatient Encounter Prescriptions as of 08/23/2016  Medication Sig  . ADVAIR DISKUS 100-50 MCG/DOSE AEPB Take 1 puff by mouth 2 (two) times daily.  . ALMarland KitchenRAZolam (XANAX) 0.25 MG tablet Take 0.25 mg by mouth daily as needed for anxiety.  . atMarland Kitchenrvastatin (LIPITOR) 20 MG tablet Take 20 mg by mouth daily.  . cholecalciferol (VITAMIN D) 1000 UNITS tablet Take 2,000 Units by mouth daily.  . Multiple Vitamins-Minerals (MULTIVITAMIN ADULT PO) Take 6 tablets by mouth daily.  . PRMarland KitchenAIR HFA 108 (90 BASE) MCG/ACT inhaler Take 2 puffs by mouth every 4 (four) hours as needed.  . venlafaxine XR (EFFEXOR-XR) 75 MG 24 hr capsule 75 mg daily.  . [DISCONTINUED] beta carotene w/minerals (OCUVITE) tablet Take 1 tablet by mouth daily.  . [DISCONTINUED] Calcium-Magnesium 300-300 MG TABS Take 1 tablet by mouth daily.  . [DISCONTINUED] clonazePAM (KLONOPIN) 0.5 MG tablet Take 1/2 to 1 tablet twice daily (Patient not taking: Reported on 08/23/2016)   No facility-administered encounter medications on file as of 08/23/2016.     Allergies  Allergen Reactions  . Celexa [Citalopram] Anxiety    Family History  Problem Relation Age of Onset  . Heart disease Mother   . Heart disease Maternal Grandmother   . Lung cancer Father   . COPD Mother     Social History   Social History  . Marital status: Single    Spouse name: N/A  . Number of children: N/A  . Years of education: N/A   Occupational History  . Not on file.   Social History Main Topics  . Smoking status: Passive Smoke Exposure - Never Smoker  . Smokeless tobacco: Not on file  . Alcohol use Not on file  . Drug use: Unknown  . Sexual activity: Not on file   Other Topics Concern  . Not on file   Social History Narrative  . No narrative on file    Current Medications, Allergies, Past Medical History, Past Surgical History, Family  History, and  Social History were reviewed in [employer] record.   Review of Systems              All symptoms NEG except where BOLDED >>  Constitutional:  F/C/S, fatigue, anorexia, unexpected weight change. HEENT:  HA, visual changes, hearing loss, earache, nasal symptoms, sore throat, mouth sores, hoarseness. Resp:  cough, sputum, hemoptysis; SOB, tightness, wheezing. Cardio:  CP, palpit, DOE, orthopnea, edema. GI:  N/V/D/C, blood in stool; reflux, abd pain, distention, gas. GU:  dysuria, freq, urgency, hematuria, flank pain, voiding difficulty. MS:  joint pain, swelling, tenderness, decr ROM; neck pain, back pain, etc. Neuro:  HA, tremors, seizures, dizziness, syncope, weakness, numbness, gait abn. Skin:  suspicious lesions or skin rash. Heme:  adenopathy, bruising, bleeding. Psyche:  confusion, agitation, sleep disturbance, hallucinations, anxiety, depression suicidal.   Objective:   Physical Exam        Vital Signs:  Reviewed...   General:  WD, WN, 60 y/o WF in NAD; alert & oriented; pleasant & cooperative... HEENT:  [place]/AT; Conjunctiva- pink, Sclera- nonicteric, EOM-wnl, PERRLA, EACs-clear, TMs-wnl; NOSE-clear; THROAT-clear & wnl.  Neck:  Supple w/ full ROM; no JVD; normal carotid impulses w/o bruits; no thyromegaly or nodules palpated; no lymphadenopathy.  Chest:  Clear to P & A; without wheezes, rales, or rhonchi heard. Heart:  Regular Rhythm; norm S1 & S2 without murmurs, rubs, or gallops detected. Abdomen:  Soft & nontender- no guarding or rebound; normal bowel sounds; no organomegaly or masses palpated. Ext:  Normal ROM; without deformities or arthritic changes; no varicose veins, venous insuffic, or edema;  Pulses intact w/o bruits. Neuro:  No focal neuro deficits; sensory testing normal; gait normal & balance OK. Derm:  No lesions noted; no rash etc. Lymph:  No cervical, supraclavicular, axillary, or inguinal adenopathy palpated.   Assessment:      IMP >>      Dyspnea, chest tightness, & chest discomfort> I suspect a component from "chest wall musc spasm" to explain her symptomatology; this is multifactorial & we discussed trial :combination relaxer" like KLONOPIN 0.'5mg'$  Bid but she decided not to take this med; asked to try her shorter acting Alpraz prn to see if there is some improvement...     Hx lifelong asthma>  Mild intermittent uncomplicated w/ baseline CXR clear & wnl, plus baseline Spirometry wnl; under control w/ Advair100Bid & ProairHFA rescue inhaler prn...    Abn CT Chest 05/2015 w/ mild bilat apical pleuroparenchymal scarring & 21m LUL nodule that needs continued CT follow up...    Medical issues> Coronary calcif seen on CT Chest- takes ASA325 daily; Scoliosis seen on CXR; Hyperlipidemia- started on Lip20; Vit D defic on VitD supplement ~2000u daily; Hx anxiety & depression on Alpraz0.25 & EffexorXR75  PLAN >>  05/19/15>   RLakenyahas a lifelong hx of asthma but despite her intermittent difficulties she appears to have been well controlled and currently her CXR & Spirometry are WNL;  Her complaints of dyspnea & chest discomfort sounds to me to be reminiscent of "chest wall musc spasm" and we spent some time discussing this in the office today & I gave her a lot of reassurance;  In this regard- I have asked her to continue her Advair100Bid and Proair rescue inhaler prn, plus we are going to give her a trial of a "combination relaxer" like KLONOPIN 0.'5mg'$ - 1/2 to 1 tab Bid regularly over the next month;  With her family hx we are going to check a  CT Chest for completeness, & we plan a brief recheck in about a month but she knows she may call upon me for any breathing issues in the interim 07/14/15>   We discussed the pros and cons of taking the Klonopin; in any event her dyspnea is mild and intermittent- she will continue on the Advair100Bid & use the Proair & Alpraz prn;  REC to f/u w/ me in 35yrfor recheck and f/u CT Chest to check the  nodule.. 08/23/16>  RMenais clinically stable and asymptomatic- her asthma controlled on the Advair100 + rescue inhaler prn (hasn't needed), her dyspnea improved as well- she remains active & has the Alprazolam to use prn; finally her CT Chest is similar in appearance to prev w/ tiny ~571mLUL nodule that we are following, Radiology hedged their bet w/ min enlargement felt to be ?real vs slice selection=> they rec continued f/u CT scans.     Plan:     Patient's Medications  New Prescriptions   No medications on file  Previous Medications   ADVAIR DISKUS 100-50 MCG/DOSE AEPB    Take 1 puff by mouth 2 (two) times daily.   ALPRAZOLAM (XANAX) 0.25 MG TABLET    Take 0.25 mg by mouth daily as needed for anxiety.   ATORVASTATIN (LIPITOR) 20 MG TABLET    Take 20 mg by mouth daily.   CHOLECALCIFEROL (VITAMIN D) 1000 UNITS TABLET    Take 2,000 Units by mouth daily.   MULTIPLE VITAMINS-MINERALS (MULTIVITAMIN ADULT PO)    Take 6 tablets by mouth daily.   PROAIR HFA 108 (90 BASE) MCG/ACT INHALER    Take 2 puffs by mouth every 4 (four) hours as needed.   VENLAFAXINE XR (EFFEXOR-XR) 75 MG 24 HR CAPSULE    75 mg daily.  Modified Medications   No medications on file  Discontinued Medications   BETA CAROTENE W/MINERALS (OCUVITE) TABLET    Take 1 tablet by mouth daily.   CALCIUM-MAGNESIUM 300-300 MG TABS    Take 1 tablet by mouth daily.   CLONAZEPAM (KLONOPIN) 0.5 MG TABLET    Take 1/2 to 1 tablet twice daily

## 2016-08-24 ENCOUNTER — Ambulatory Visit (INDEPENDENT_AMBULATORY_CARE_PROVIDER_SITE_OTHER)
Admission: RE | Admit: 2016-08-24 | Discharge: 2016-08-24 | Disposition: A | Discharge status: Home / Self Care | Payer: 59 | Source: Ambulatory Visit | Attending: Pulmonary Disease | Admitting: Pulmonary Disease

## 2016-08-24 DIAGNOSIS — J4520 Mild intermittent asthma, uncomplicated: Secondary | ICD-10-CM | POA: Diagnosis not present

## 2016-08-24 DIAGNOSIS — R911 Solitary pulmonary nodule: Secondary | ICD-10-CM

## 2016-08-28 ENCOUNTER — Telehealth: Payer: Self-pay | Admitting: Pulmonary Disease

## 2016-08-28 NOTE — Telephone Encounter (Signed)
Pt returning SN's call regarding CT chest.   I do not see result notes documented on CT.   SN please advise.  Thanks!

## 2016-08-29 NOTE — Telephone Encounter (Signed)
SN spoke with this pt yesterday.  Nothing further is needed

## 2016-08-29 NOTE — Telephone Encounter (Signed)
SN please advise if you were trying to reach this pt.  thanks

## 2016-10-25 DIAGNOSIS — R600 Localized edema: Secondary | ICD-10-CM | POA: Diagnosis not present

## 2016-10-25 DIAGNOSIS — M79604 Pain in right leg: Secondary | ICD-10-CM | POA: Diagnosis not present

## 2016-10-25 DIAGNOSIS — M79605 Pain in left leg: Secondary | ICD-10-CM | POA: Diagnosis not present

## 2016-10-26 DIAGNOSIS — M79605 Pain in left leg: Secondary | ICD-10-CM | POA: Diagnosis not present

## 2016-10-26 DIAGNOSIS — M79604 Pain in right leg: Secondary | ICD-10-CM | POA: Diagnosis not present

## 2016-11-01 DIAGNOSIS — I8311 Varicose veins of right lower extremity with inflammation: Secondary | ICD-10-CM | POA: Diagnosis not present

## 2017-01-05 ENCOUNTER — Other Ambulatory Visit: Payer: Self-pay | Admitting: Family Medicine

## 2017-01-05 DIAGNOSIS — Z1231 Encounter for screening mammogram for malignant neoplasm of breast: Secondary | ICD-10-CM

## 2017-01-24 ENCOUNTER — Ambulatory Visit
Admission: RE | Admit: 2017-01-24 | Discharge: 2017-01-24 | Disposition: A | Discharge status: Home / Self Care | Payer: 59 | Source: Ambulatory Visit | Attending: Family Medicine | Admitting: Family Medicine

## 2017-01-24 DIAGNOSIS — Z1231 Encounter for screening mammogram for malignant neoplasm of breast: Secondary | ICD-10-CM | POA: Diagnosis not present

## 2017-02-22 ENCOUNTER — Ambulatory Visit: Payer: 59 | Admitting: Pulmonary Disease

## 2017-02-28 ENCOUNTER — Ambulatory Visit: Payer: 59 | Admitting: Pulmonary Disease

## 2017-03-06 DIAGNOSIS — L237 Allergic contact dermatitis due to plants, except food: Secondary | ICD-10-CM | POA: Diagnosis not present

## 2017-03-07 ENCOUNTER — Ambulatory Visit (INDEPENDENT_AMBULATORY_CARE_PROVIDER_SITE_OTHER): Payer: 59 | Admitting: Pulmonary Disease

## 2017-03-07 VITALS — BP 110/70 | HR 74 | Temp 97.5°F | Ht 66.0 in | Wt 152.2 lb

## 2017-03-07 DIAGNOSIS — J4520 Mild intermittent asthma, uncomplicated: Secondary | ICD-10-CM | POA: Diagnosis not present

## 2017-03-07 DIAGNOSIS — R911 Solitary pulmonary nodule: Secondary | ICD-10-CM

## 2017-03-07 NOTE — Patient Instructions (Signed)
Today we updated your med list in our EPIC system...    Continue your current medications the same...  Continue the ADVAIR twice daily...  Continue the PROAIR-HFA rescue inhaler as needed...  We will sched a follow up CT Chest at your convenience to check on the LUL nodule...    We will contact you w/ the results when available...   Call for any questions...  Let's plan a follow up visit in 75mo, sooner if needed for problems.Marland KitchenMarland Kitchen

## 2017-03-08 ENCOUNTER — Ambulatory Visit (INDEPENDENT_AMBULATORY_CARE_PROVIDER_SITE_OTHER)
Admission: RE | Admit: 2017-03-08 | Discharge: 2017-03-08 | Disposition: A | Discharge status: Home / Self Care | Payer: 59 | Source: Ambulatory Visit | Attending: Pulmonary Disease | Admitting: Pulmonary Disease

## 2017-03-08 ENCOUNTER — Encounter: Payer: Self-pay | Admitting: Pulmonary Disease

## 2017-03-08 DIAGNOSIS — R911 Solitary pulmonary nodule: Secondary | ICD-10-CM | POA: Diagnosis not present

## 2017-03-08 NOTE — Progress Notes (Signed)
Subjective:     Patient ID: Wendy [name], female   DOB: [birth year], 61 y.o.   MRN: [id number]  HPI  ~  May 19, 2015:  Initial Pulmonary Consult by SN>>   33 y/o WF, referred by Dr. Kelton [name], Sadie Haber at [facility], for a pulmonary evaluation due to chest tightness and a hx of asthma>      Wendy [name] tells me that she has had Asthma ever since she was about 61 y/o when she had a bout of double pneumonia & told she had Asthma after that; as a child she recalls ER visits and shots w/ one other hosp around age 47 due to an asthma attack; she never used any inhalers until her teenage yrs when she started using OTC Primateen... In her adult life she notes occas episodes of bronchitis requiring antibiotics, and was started on Advair100 approx 10 yrs ago- initially used just one inhalation daily & recently incr to one inhalation Bid due to increased difficulty breathing & having to use her rescue inhaler (Proair) more often... She describes chest tightness ans some intermittent right chest discomfort but she actually denies wheezing, cough, sputum, hemoptysis, f/c/s, sinus symptoms, reflux, etc; her sensation of SOB is hard for her to characterize but she feels that it's more difficult to get the air "IN", not satisfied breathing, "not enough room for the air in her lungs"; she is active & walks ~68mn most days, denies DOE, does some light yard work but [employer] denies any progressive DOE...       She is a never smoker but was exposed to second hand smoke from her parents- Father died from small cell ca, and Mother has COPD & CHF; she believes that her paternal grandfather had silicosis (she says he was a well digger); [place] in [place] exposures over he lifetime...      She has a scratchy voice which she says occurs most mornings but denies sore throat, reflux symptoms, cough at night, hx of HH etc; she sleeps well, wakes refreshed, but enjoys an  afternoon nap & gets tired by 8PM; she denies being told about snoring, restless sleeping, etc; she denies any bad allergies and has a dog (scottish terrier) and 2 cats...      EXAM reveals Afeb, VSS, O2sat=95% on RA;  HEENT- neg, mallampati1;  Chest- clear w/o w/r/r;  Heart- RR w/o m/r/g;  Abd- soft, nontender, neg;  Ext- neg w/o c/c/e;  Neuro- intact...  CXR 05/19/15 showed norm heart size, clear lungs, scoliosis, NAD....   Spirometry 05/19/15 showed FVC=3.3 (94%), FEV1=2.6 (92%), %1sec=76, mid-flows are wnl at 89% of predicted...  CT Chest w/o contrast ordered & pending IMP/PLAN>>  Wendy [name] a lifelong hx of asthma but despite her intermittent difficulties she appears to have been well controlled and currently her CXR & Spirometry are WNL;  Her complaints of dyspnea & chest discomfort sounds to me to be reminiscent of "chest wall musc spasm" and we spent some time discussing this in the office today & I gave her a lot of reassurance;  In this regard- I have asked her to continue her Advair100Bid and Proair rescue inhaler prn, plus we are going to give her a trial of a "combination relaxer" like KLONOPIN 0.551m 1/2 to 1 tab Bid regularly over the next month;  With her family hx we are going to check a CT Chest for completeness, & we plan a brief recheck  in about a month but she knows she may call upon me for any breathing issues in the interim...  ADDENDUM>>  CT Chest 05/26/15 showed mild bilat apical pleuroparenchymal scarring w/ 33m left apical nodule (likely scar tissue), no adenopathy; incidental findings include: coronary calcif & poss gallstone-  I called pt w/ report... SMN    ~  July 14, 2015:  272moOV w/ SN>  [name] that she filled the presciption for the Klonopin but decided NOT to take it; notes that her breathing is about the same on her Advair100-Bid & Proair rescue inhaler prn;  In the interim she tells me that she had a CARDIAC eval by Wendy [name] (?due to calcif  seen on CT Chest?) but we do not have any access to his notes or eval and she is asked to get notes sent to usKoreao scan into Epic...  We reviewed the following medical problems during today's office visit >>     Dyspnea>  This is multifactorial & we discussed trial :combination relaxer" like KLONOPIN 0.104m12mid but she decided not to take this med; asked to try her shorter acting Alpraz prn to see if there is some improvement...     Hx lifelong asthma>  Mild intermittent uncomplicated w/ baseline CXR clear & wnl, plus baseline Spirometry wnl; under control w/ Advair100Bid & ProairHFA rescue inhaler prn...    Abn CT Chest 05/2015 w/ mild bilat apical pleuroparenchymal scarring & 104mm64mL nodule that needs f/u 48yr 102yredical issues> Coronary calcif seen on CT Chest- takes ASA325 daily; Scoliosis seen on CXR; Hyperlipidemia- started on Lip20; Vit D defic on VitD supplement ~2000u daily; Hx anxiety & depression on Alpraz0.25 & EffexorXR75 EXAM reveals Afeb, VSS, O2sat=98% on RA;  HEENT- neg, mallampati1;  Chest- clear w/o w/r/r;  Heart- RR w/o m/r/g;  Abd- soft, nontender, neg;  Ext- neg w/o c/c/e;  Neuro- intact... IMP/PLAN>>  We discussed the pros and cons of taking the Klonopin; in any event her dyspnea is mild and intermittent- she will continue on the Advair100Bid & use the Proair & Alpraz prn;  REC to f/u w/ me in 48yr f49yrecheck and f/u CT Chest to check the nodule...   ~  August 23, 2016:  14mo R32mo SN>  Wendy [name] feeling great and denies any cardio-resp symptoms- no cough, sput, hemoptysis, CP, SOB, wheezing/ tightness, etc;  She denies CP/ angina, palpit, dizzy, edema, etc; no f/c/s...  She remains on Advair100bid & Proair rescue (hasn't needed) but she tells me that her organic black elderberry syrup has really helped her resp issues... We reviewed the following medical problems during today's office visit>     Dyspnea>  This is multifactorial & we discussed trial :combination relaxer" like  KLONOPIN 0.104mg Bid16mt she preferred to use her Xanax 0.25 prn...    Hx lifelong asthma>  Mild intermittent uncomplicated w/ baseline CXR clear & wnl, plus baseline Spirometry wnl; under control w/ Advair100Bid & ProairHFA rescue inhaler prn...    Abn CT Chest 05/2015 w/ mild bilat apical pleuroparenchymal scarring & 104mm LUL 39mule that needs f/u CT Chest now...    Medical issues> Coronary calcif seen on CT Chest- takes ASA325 daily; Scoliosis seen on CXR; Hyperlipidemia- started on Lip20; Vit D defic on VitD supplement ~2000u daily; Hx anxiety & depression on Alpraz0.25 & EffexorXR75 EXAM reveals Afeb, VSS, O2sat=94% on RA;  HEENT- neg, mallampati1;  Chest- clear w/o w/r/r;  Heart- RR w/o m/r/g;  Abd- soft, nontender, neg;  Ext- neg w/o c/c/e;  Neuro- intact...  CT Chest 08/24/16>  Norm heart size w/ coronary calcif seen; no enlarged mediastinal nodes; left apical nodule measures 5.5x4.652m (sl larger) but it is unchanged at 5x663mon the coronal images; mild apical scarring, no new lesions etc; Radiology feels that the diff could be from slice selection & they rec f/u CT in 6 months... Pt notified & she will ret in 52m24mor recheck.  IMP/PLAN>>  RobLatoy clinically stable and asymptomatic- her asthma controlled on the Advair100 + rescue inhaler prn (hasn't needed), her dyspnea improved as well- she remains active & has the Alprazolam to use prn; finally her CT Chest is similar in appearance to prev w/ tiny ~5mm24mL nodule that we are following, Radiology hedged their bet w/ min enlargement felt to be ?real vs slice selection=> they rec continued f/u CT scans...   ~  March 07, 2017:  52mo 37mo& Kyandra notes that the hot humid weather effects her breathing  & she's been using the rescue inhaler ~2x/d;  Asked to continue the Advair100bid regularly & plan to exercise indoors at gym etc;  She remains quite concerned about the tiny 5mm L63mnodule and wants another CT scan at this time despite the fact that it really  hadn't changed from 9/16 to 12/17;  She has Xanax 0.25mg t41mlp w/ anxiety & dyspnea, and she takes Black Elderberry syrup to help her immune system, fight infections, and congestion...    We reviewed the following medical problems during today's office visit >> Her PCP is Dr. Elaine Wendy Pillarpnea>  This is multifactorial & we discussed trial :combination relaxer" like KLONOPIN 0.5mg Bid21mt she preferred to use her Xanax 0.25 prn...    Hx lifelong asthma>  Mild intermittent uncomplicated w/ baseline CXR clear & wnl, plus baseline Spirometry wnl; under control w/ Advair100Bid & ProairHFA rescue inhaler prn...    Abn CT Chest 05/2015 w/ mild bilat apical pleuroparenchymal scarring & 5mm LUL 42mule => serial f/u CT Chest scans without growth in this lesion, she remains concerned & we continue to follow...    Medical issues> Coronary calcif seen on CT Chest- takes ASA325 daily; Scoliosis seen on CXR; Hyperlipidemia- started on Lip20; Vit D defic on VitD supplement ~2000u daily; Hx anxiety & depression on Alpraz0.25 & EffexorXR75 EXAM reveals Afeb, VSS, O2sat=94% on RA;  HEENT- neg, mallampati1;  Chest- clear w/o w/r/r;  Heart- RR w/o m/r/g;  Abd- soft, nontender, neg;  Ext- neg w/o c/c/e;  Neuro- intact...  CT Chest 03/08/17>  Norm heart size, aortic atherosclerosis, coronary calcif in LAD; no adenopathy, LUL pulm nodule measures 52mm & not31mgnif changed from 9/16 or 12/17; no new lesions; scoliosis is ident... IMP/PLAN>>  Arryn remaKorinelinically stable but also concerned about this LUL lesion, wants repeat f/u CT in 1yr;  We r27yrwed continued Advair, AlbutHFA meds, plus exercise program...     Past Medical History  Diagnosis Date  . Asthma    Hx UTIs    Scoliosis    Hx Vit D defic >> on Vit D 2000u daily and calcium + MVI    Hx anxiety & depression >> on Alprazolam 0.25 prn & EffexorXR75    Coronary calcif seen on CT Chest>> [facility]    Hyperlipidemia  >> started on ASA 325mg & Lipi18m20mg by Card10m     No past surgical history on file.  Removal of basal cell cancer from right clavicular area 2015   Outpatient Encounter Prescriptions as of 03/07/2017  Medication Sig  . ADVAIR DISKUS 100-50 MCG/DOSE AEPB Take 1 puff by mouth 2 (two) times daily.  Marland Kitchen ALPRAZolam (XANAX) 0.25 MG tablet Take 0.25 mg by mouth daily as needed for anxiety.  Marland Kitchen atorvastatin (LIPITOR) 20 MG tablet Take 20 mg by mouth daily.  . cholecalciferol (VITAMIN D) 1000 UNITS tablet Take 2,000 Units by mouth daily.  . Multiple Vitamins-Minerals (MULTIVITAMIN ADULT PO) Take 6 tablets by mouth daily.  Marland Kitchen PROAIR HFA 108 (90 BASE) MCG/ACT inhaler Take 2 puffs by mouth every 4 (four) hours as needed.  . venlafaxine XR (EFFEXOR-XR) 75 MG 24 hr capsule 75 mg daily.   No facility-administered encounter medications on file as of 03/07/2017.     Allergies  Allergen Reactions  . Celexa [Citalopram] Anxiety    Immunization History  Administered Date(s) Administered  . Pneumococcal Polysaccharide-23 02/16/2015    Current Medications, Allergies, Past Medical History, Past Surgical History, Family History, and Social History were reviewed in [employer] record.   Review of Systems             All symptoms NEG except where BOLDED >>  Constitutional:  F/C/S, fatigue, anorexia, unexpected weight change. HEENT:  HA, visual changes, hearing loss, earache, nasal symptoms, sore throat, mouth sores, hoarseness. Resp:  cough, sputum, hemoptysis; SOB, tightness, wheezing. Cardio:  CP, palpit, DOE, orthopnea, edema. GI:  N/V/D/C, blood in stool; reflux, abd pain, distention, gas. GU:  dysuria, freq, urgency, hematuria, flank pain, voiding difficulty. MS:  joint pain, swelling, tenderness, decr ROM; neck pain, back pain, etc. Neuro:  HA, tremors, seizures, dizziness, syncope, weakness, numbness, gait abn. Skin:  suspicious lesions or skin rash. Heme:  adenopathy,  bruising, bleeding. Psyche:  confusion, agitation, sleep disturbance, hallucinations, anxiety, depression suicidal.   Objective:   Physical Exam       Vital Signs:  Reviewed...   General:  WD, WN, 61 y/o WF in NAD; alert & oriented; pleasant & cooperative... HEENT:  [place]/AT; Conjunctiva- pink, Sclera- nonicteric, EOM-wnl, PERRLA, EACs-clear, TMs-wnl; NOSE-clear; THROAT-clear & wnl.  Neck:  Supple w/ full ROM; no JVD; normal carotid impulses w/o bruits; no thyromegaly or nodules palpated; no lymphadenopathy.  Chest:  Clear to P & A; without wheezes, rales, or rhonchi heard. Heart:  Regular Rhythm; norm S1 & S2 without murmurs, rubs, or gallops detected. Abdomen:  Soft & nontender- no guarding or rebound; normal bowel sounds; no organomegaly or masses palpated. Ext:  Normal ROM; without deformities or arthritic changes; no varicose veins, venous insuffic, or edema;  Pulses intact w/o bruits. Neuro:  No focal neuro deficits; sensory testing normal; gait normal & balance OK. Derm:  No lesions noted; no rash etc. Lymph:  No cervical, supraclavicular, axillary, or inguinal adenopathy palpated.   Assessment:      IMP >>     Dyspnea, chest tightness, & chest discomfort> I suspect a component from "chest wall musc spasm" to explain her symptomatology; this is multifactorial & we discussed trial :combination relaxer" like KLONOPIN 0.37m Bid but she decided not to take this med; asked to try her shorter acting Alpraz prn to see if there is some improvement...     Hx lifelong asthma>  Mild intermittent uncomplicated w/ baseline CXR clear & wnl, plus baseline Spirometry wnl; under control w/ Advair100Bid & ProairHFA rescue inhaler prn...    Abn CT Chest 05/2015 w/ mild bilat apical pleuroparenchymal  scarring & 71m LUL nodule that needs continued CT follow up...    Medical issues> Coronary calcif seen on CT Chest- takes ASA325 daily; Scoliosis seen on CXR; Hyperlipidemia- started on Lip20; Vit D defic on  VitD supplement ~2000u daily; Hx anxiety & depression on Alpraz0.25 & EffexorXR75  PLAN >>  05/19/15>   RRaynhas a lifelong hx of asthma but despite her intermittent difficulties she appears to have been well controlled and currently her CXR & Spirometry are WNL;  Her complaints of dyspnea & chest discomfort sounds to me to be reminiscent of "chest wall musc spasm" and we spent some time discussing this in the office today & I gave her a lot of reassurance;  In this regard- I have asked her to continue her Advair100Bid and Proair rescue inhaler prn, plus we are going to give her a trial of a "combination relaxer" like KLONOPIN 0.568m 1/2 to 1 tab Bid regularly over the next month;  With her family hx we are going to check a CT Chest for completeness, & we plan a brief recheck in about a month but she knows she may call upon me for any breathing issues in the interim 07/14/15>   We discussed the pros and cons of taking the Klonopin; in any event her dyspnea is mild and intermittent- she will continue on the Advair100Bid & use the Proair & Alpraz prn;  REC to f/u w/ me in 1y12yrr recheck and f/u CT Chest to check the nodule.. 08/23/16>  RobJuliet clinically stable and asymptomatic- her asthma controlled on the Advair100 + rescue inhaler prn (hasn't needed), her dyspnea improved as well- she remains active & has the Alprazolam to use prn; finally her CT Chest is similar in appearance to prev w/ tiny ~5mm103mL nodule that we are following, Radiology hedged their bet w/ min enlargement felt to be ?real vs slice selection=> they rec continued f/u CT scans.     Plan:     Patient's Medications  New Prescriptions   No medications on file  Previous Medications   ADVAIR DISKUS 100-50 MCG/DOSE AEPB    Take 1 puff by mouth 2 (two) times daily.   ALPRAZOLAM (XANAX) 0.25 MG TABLET    Take 0.25 mg by mouth daily as needed for anxiety.   ATORVASTATIN (LIPITOR) 20 MG TABLET    Take 20 mg by mouth daily.    CHOLECALCIFEROL (VITAMIN D) 1000 UNITS TABLET    Take 2,000 Units by mouth daily.   MULTIPLE VITAMINS-MINERALS (MULTIVITAMIN ADULT PO)    Take 6 tablets by mouth daily.   PROAIR HFA 108 (90 BASE) MCG/ACT INHALER    Take 2 puffs by mouth every 4 (four) hours as needed.   VENLAFAXINE XR (EFFEXOR-XR) 75 MG 24 HR CAPSULE    75 mg daily.  Modified Medications   No medications on file  Discontinued Medications   No medications on file

## 2017-05-17 ENCOUNTER — Other Ambulatory Visit (HOSPITAL_COMMUNITY)
Admission: RE | Admit: 2017-05-17 | Discharge: 2017-05-17 | Disposition: A | Discharge status: Home / Self Care | Payer: 59 | Source: Ambulatory Visit | Attending: Family Medicine | Admitting: Family Medicine

## 2017-05-17 ENCOUNTER — Other Ambulatory Visit: Payer: Self-pay | Admitting: Family Medicine

## 2017-05-17 DIAGNOSIS — M109 Gout, unspecified: Secondary | ICD-10-CM | POA: Diagnosis not present

## 2017-05-17 DIAGNOSIS — Z0000 Encounter for general adult medical examination without abnormal findings: Secondary | ICD-10-CM | POA: Diagnosis not present

## 2017-05-17 DIAGNOSIS — Z124 Encounter for screening for malignant neoplasm of cervix: Secondary | ICD-10-CM | POA: Diagnosis not present

## 2017-05-17 DIAGNOSIS — E7800 Pure hypercholesterolemia, unspecified: Secondary | ICD-10-CM | POA: Diagnosis not present

## 2017-05-17 DIAGNOSIS — Z01411 Encounter for gynecological examination (general) (routine) with abnormal findings: Secondary | ICD-10-CM | POA: Insufficient documentation

## 2017-05-17 DIAGNOSIS — R7303 Prediabetes: Secondary | ICD-10-CM | POA: Diagnosis not present

## 2017-05-23 LAB — CYTOLOGY - PAP: Diagnosis: NEGATIVE

## 2017-07-19 DIAGNOSIS — D225 Melanocytic nevi of trunk: Secondary | ICD-10-CM | POA: Diagnosis not present

## 2017-07-19 DIAGNOSIS — L814 Other melanin hyperpigmentation: Secondary | ICD-10-CM | POA: Diagnosis not present

## 2017-07-19 DIAGNOSIS — L821 Other seborrheic keratosis: Secondary | ICD-10-CM | POA: Diagnosis not present

## 2017-07-19 DIAGNOSIS — D485 Neoplasm of uncertain behavior of skin: Secondary | ICD-10-CM | POA: Diagnosis not present

## 2017-07-19 DIAGNOSIS — Z85828 Personal history of other malignant neoplasm of skin: Secondary | ICD-10-CM | POA: Diagnosis not present

## 2017-08-23 ENCOUNTER — Ambulatory Visit: Payer: 59 | Admitting: Pulmonary Disease

## 2017-08-23 ENCOUNTER — Encounter: Payer: Self-pay | Admitting: Pulmonary Disease

## 2017-08-23 VITALS — BP 104/62 | HR 73 | Temp 98.2°F | Ht 67.0 in | Wt 166.6 lb

## 2017-08-23 DIAGNOSIS — J4520 Mild intermittent asthma, uncomplicated: Secondary | ICD-10-CM

## 2017-08-23 DIAGNOSIS — R911 Solitary pulmonary nodule: Secondary | ICD-10-CM

## 2017-08-23 NOTE — Patient Instructions (Signed)
Today we updated your med list in our EPIC system...    Continue your current medications the same...  Call for any questions....  Let's plan a follow up visit in 69mo (this will be the time for your follow up CT Chest).Marland KitchenMarland Kitchen

## 2017-08-23 NOTE — Progress Notes (Addendum)
Subjective:     Patient ID: Wendy [name], female   DOB: Jan 23, 1956, 61 y.o.   MRN: [id number]  HPI  ~  May 19, 2015:  Initial Pulmonary Consult by SN>>   6 y/o WF, referred by Dr. Kelton Pillar, Sadie Haber at [facility], for a pulmonary evaluation due to chest tightness and a hx of asthma>      [name] tells me that she has had Asthma ever since she was about 61 y/o when she had a bout of double pneumonia & told she had Asthma after that; as a child she recalls ER visits and shots w/ one other hosp around age 61 due to an asthma attack; she never used any inhalers until her teenage yrs when she started using OTC Primateen... In her adult life she notes occas episodes of bronchitis requiring antibiotics, and was started on Advair100 approx 10 yrs ago- initially used just one inhalation daily & recently incr to one inhalation Bid due to increased difficulty breathing & having to use her rescue inhaler (Proair) more often... She describes chest tightness ans some intermittent right chest discomfort but she actually denies wheezing, cough, sputum, hemoptysis, f/c/s, sinus symptoms, reflux, etc; her sensation of SOB is hard for her to characterize but she feels that it's more difficult to get the air "IN", not satisfied breathing, "not enough room for the air in her lungs"; she is active & walks ~53mn most days, denies DOE, does some light yard work but d[employer] Minor denies any progressive DOE...       She is a never smoker but was exposed to second hand smoke from her parents- Father died from small cell ca, and Mother has COPD & CHF; she believes that her paternal grandfather had silicosis (she says he was a well digger); she is employed in the IT Dept at [employer] any known occupational exposures over he lifetime...      She has a scratchy voice which she says occurs most mornings but denies sore throat, reflux symptoms, cough at night, hx of HH etc; she sleeps well, wakes refreshed, but enjoys an  afternoon nap & gets tired by 8PM; she denies being told about snoring, restless sleeping, etc; she denies any bad allergies and has a dog (scottish terrier) and 2 cats...      EXAM reveals Afeb, VSS, O2sat=95% on RA;  HEENT- neg, mallampati1;  Chest- clear w/o w/r/r;  Heart- RR w/o m/r/g;  Abd- soft, nontender, neg;  Ext- neg w/o c/c/e;  Neuro- intact...  CXR 05/19/15 showed norm heart size, clear lungs, scoliosis, NAD....   Spirometry 05/19/15 showed FVC=3.3 (94%), FEV1=2.6 (92%), %1sec=76, mid-flows are wnl at 89% of predicted...  CT Chest w/o contrast ordered & pending IMP/PLAN>>  RCeceiliahas a lifelong hx of asthma but despite her intermittent difficulties she appears to have been well controlled and currently her CXR & Spirometry are WNL;  Her complaints of dyspnea & chest discomfort sounds to me to be reminiscent of "chest wall musc spasm" and we spent some time discussing this in the office today & I gave her a lot of reassurance;  In this regard- I have asked her to continue her Advair100Bid and Proair rescue inhaler prn, plus we are going to give her a trial of a "combination relaxer" like KLONOPIN 0.'5mg'$ - 1/2 to 1 tab Bid regularly over the next month;  With her family hx we are going to check a CT Chest for completeness, & we plan a brief recheck  in about a month but she knows she may call upon me for any breathing issues in the interim...  ADDENDUM>>  CT Chest 05/26/15 showed mild bilat apical pleuroparenchymal scarring w/ 33m left apical nodule (likely scar tissue), no adenopathy; incidental findings include: coronary calcif & poss gallstone-  I called pt w/ report... SMN    ~  July 14, 2015:  272moOV w/ SN>  [name] that she filled the presciption for the Klonopin but decided NOT to take it; notes that her breathing is about the same on her Advair100-Bid & Proair rescue inhaler prn;  In the interim she tells me that [facility] (?due to calcif  seen on CT Chest?) but we do not have any access to his notes or eval and she is asked to get notes sent to usKoreao scan into Epic...  We reviewed the following medical problems during today's office visit >>     Dyspnea>  This is multifactorial & we discussed trial :combination relaxer" like KLONOPIN 0.104m12mid but she decided not to take this med; asked to try her shorter acting Alpraz prn to see if there is some improvement...     Hx lifelong asthma>  Mild intermittent uncomplicated w/ baseline CXR clear & wnl, plus baseline Spirometry wnl; under control w/ Advair100Bid & ProairHFA rescue inhaler prn...    Abn CT Chest 05/2015 w/ mild bilat apical pleuroparenchymal scarring & 104mm64mL nodule that needs f/u 48yr 102yredical issues> Coronary calcif seen on CT Chest- takes ASA325 daily; Scoliosis seen on CXR; Hyperlipidemia- started on Lip20; Vit D defic on VitD supplement ~2000u daily; Hx anxiety & depression on Alpraz0.25 & EffexorXR75 EXAM reveals Afeb, VSS, O2sat=98% on RA;  HEENT- neg, mallampati1;  Chest- clear w/o w/r/r;  Heart- RR w/o m/r/g;  Abd- soft, nontender, neg;  Ext- neg w/o c/c/e;  Neuro- intact... IMP/PLAN>>  We discussed the pros and cons of taking the Klonopin; in any event her dyspnea is mild and intermittent- she will continue on the Advair100Bid & use the Proair & Alpraz prn;  REC to f/u w/ me in 48yr f49yrecheck and f/u CT Chest to check the nodule...   ~  August 23, 2016:  14mo R32mo SN>  [name] feeling great and denies any cardio-resp symptoms- no cough, sput, hemoptysis, CP, SOB, wheezing/ tightness, etc;  She denies CP/ angina, palpit, dizzy, edema, etc; no f/c/s...  She remains on Advair100bid & Proair rescue (hasn't needed) but she tells me that her organic black elderberry syrup has really helped her resp issues... We reviewed the following medical problems during today's office visit>     Dyspnea>  This is multifactorial & we discussed trial :combination relaxer" like  KLONOPIN 0.104mg Bid16mt she preferred to use her Xanax 0.25 prn...    Hx lifelong asthma>  Mild intermittent uncomplicated w/ baseline CXR clear & wnl, plus baseline Spirometry wnl; under control w/ Advair100Bid & ProairHFA rescue inhaler prn...    Abn CT Chest 05/2015 w/ mild bilat apical pleuroparenchymal scarring & 104mm LUL 39mule that needs f/u CT Chest now...    Medical issues> Coronary calcif seen on CT Chest- takes ASA325 daily; Scoliosis seen on CXR; Hyperlipidemia- started on Lip20; Vit D defic on VitD supplement ~2000u daily; Hx anxiety & depression on Alpraz0.25 & EffexorXR75 EXAM reveals Afeb, VSS, O2sat=94% on RA;  HEENT- neg, mallampati1;  Chest- clear w/o w/r/r;  Heart- RR w/o m/r/g;  Abd- soft, nontender, neg;  Ext- neg w/o c/c/e;  Neuro- intact...  CT Chest 08/24/16>  Norm heart size w/ coronary calcif seen; no enlarged mediastinal nodes; left apical nodule measures 5.5x4.98m (sl larger) but it is unchanged at 5x651mon the coronal images; mild apical scarring, no new lesions etc; Radiology feels that the diff could be from slice selection & they rec f/u CT in 6 months... Pt notified & she will ret in 32m74mor recheck.  IMP/PLAN>>  RobKiante clinically stable and asymptomatic- her asthma controlled on the Advair100 + rescue inhaler prn (hasn't needed), her dyspnea improved as well- she remains active & has the Alprazolam to use prn; finally her CT Chest is similar in appearance to prev w/ tiny ~5mm71mL nodule that we are following, Radiology hedged their bet w/ min enlargement felt to be ?real vs slice selection=> they rec continued f/u CT scans...  ~  March 07, 2017:  32mo 51mo& Clorine notes that the hot humid weather effects her breathing  & she's been using the rescue inhaler ~2x/d;  Asked to continue the Advair100bid regularly & plan to exercise indoors at gym etc;  She remains quite concerned about the tiny 5mm L92mnodule and wants another CT scan at this time despite the fact that it really  hadn't changed from 9/16 to 12/17;  She has Xanax 0.'25mg'$  to help w/ anxiety & dyspnea, and she takes Black Elderberry syrup to help her immune system, fight infections, and congestion...    We reviewed the following medical problems during today's office visit >> Her PCP is Dr. ElaineKelton Pillarspnea>  This is multifactorial & we discussed trial :combination relaxer" like KLONOPIN 0.'5mg'$  Bid but she preferred to use her Xanax 0.25 prn...    Hx lifelong asthma>  Mild intermittent uncomplicated w/ baseline CXR clear & wnl, plus baseline Spirometry wnl; under control w/ Advair100Bid & ProairHFA rescue inhaler prn...    Abn CT Chest 05/2015 w/ mild bilat apical pleuroparenchymal scarring & 5mm LU37module => serial f/u CT Chest scans without growth in this lesion, she remains concerned & we continue to follow...    Medical issues> Coronary calcif seen on CT Chest- takes ASA325 daily; Scoliosis seen on CXR; Hyperlipidemia- started on Lip20; Vit D defic on VitD supplement ~2000u daily; Hx anxiety & depression on Alpraz0.25 & EffexorXR75 EXAM reveals Afeb, VSS, O2sat=94% on RA;  HEENT- neg, mallampati1;  Chest- clear w/o w/r/r;  Heart- RR w/o m/r/g;  Abd- soft, nontender, neg;  Ext- neg w/o c/c/e;  Neuro- intact...  CT Chest 03/08/17>  Norm heart size, aortic atherosclerosis, coronary calcif in LAD; no adenopathy, LUL pulm nodule measures 32mm & n24msignif changed from 9/16 or 12/17; no new lesions; scoliosis is ident... IMP/PLAN>>  Samaria reSaloni clinically stable but also concerned about this LUL lesion, wants repeat f/u CT in 58yr;  We25yriewed continued Advair, AlbutHFA meds, plus exercise program...    ~  August 23, 2017:  6 month ROV & pulmonary follow up visit>  Jarelis repCandida good interval- breathing well w/ min intermittent cough, no sput, no hemoptysis, & chr stable DOE w/o change; she uses black elderberry for congestion & says that this helps; she refuses the Flu vaccine...     We reviewed the  following medical problems during today's office visit >> Her PCP is Dr. Elaine GrKelton Pillarea>  This is multifactorial & we discussed trial :combination relaxer" like KLONOPIN 0.'5mg'$  Bid  but she preferred to use her Xanax 0.25 prn...    Hx lifelong asthma>  Mild intermittent uncomplicated w/ baseline CXR clear & wnl, plus baseline Spirometry wnl; under control w/ Advair100Bid & ProairHFA rescue inhaler prn...    Abn CT Chest 05/2015 w/ mild bilat apical pleuroparenchymal scarring & 59m LUL nodule => serial f/u CT Chest scans without growth in this lesion, she remains concerned & we continue to follow...    Medical issues> Coronary calcif seen on CT Chest- takes ASA325 daily; Scoliosis seen on CXR; Hyperlipidemia- started on Lip20; Vit D defic on VitD supplement ~2000u daily; Hx anxiety & depression on Alpraz0.25 & EffexorXR75 EXAM reveals Afeb, VSS, O2sat=95% on RA;  HEENT- neg, mallampati1;  Chest- clear w/o w/r/r;  Heart- RR w/o m/r/g;  Abd- soft, nontender, neg;  Ext- neg w/o c/c/e;  Neuro- intact... IMP/PLAN>>  RAvais stable on her Advair100Bid & prn rescue inhaler; Rec to continue same & f/u in 6109mos she remains concerned about the 24m60mUL nodule (no change on CT from time of 1st scan in Sep2016)...  ADDENDUM>>  CT Chest 03/13/18>  Norm heart size, aortic & coronary calcif, biapical pleuroparenchymal scarring & 24mm91m nodule unchanged serially, lungs otherw clear...    Past Medical History  Diagnosis Date  . Asthma    Hx UTIs    Scoliosis    Hx Vit D defic >> on Vit D 2000u daily and calcium + MVI    Hx anxiety & depression >> on Alprazolam 0.25 prn & EffexorXR75    Coronary calcif seen on CT Chest>> [facility]    Hyperlipidemia >> started on ASA '325mg'$  & Lipitor '20mg'$  by [facility]...     History reviewed. No pertinent surgical history. Removal of basal cell cancer from right clavicular area 2015   Outpatient Encounter Medications as of 08/23/2017    Medication Sig  . ADVAIR DISKUS 100-50 MCG/DOSE AEPB Take 1 puff by mouth 2 (two) times daily.  . ALMarland KitchenRAZolam (XANAX) 0.25 MG tablet Take 0.25 mg by mouth daily as needed for anxiety.  . atMarland Kitchenrvastatin (LIPITOR) 20 MG tablet Take 20 mg by mouth daily.  . cholecalciferol (VITAMIN D) 1000 UNITS tablet Take 2,000 Units by mouth daily.  . Multiple Vitamins-Minerals (MULTIVITAMIN ADULT PO) Take 6 tablets by mouth daily.  . PRMarland KitchenAIR HFA 108 (90 BASE) MCG/ACT inhaler Take 2 puffs by mouth every 4 (four) hours as needed.  . venlafaxine XR (EFFEXOR-XR) 37.5 MG 24 hr capsule Take 37.5 mg by mouth daily. Take with '75mg'$  venlafaxine  . venlafaxine XR (EFFEXOR-XR) 75 MG 24 hr capsule 112.5 mg daily.    No facility-administered encounter medications on file as of 08/23/2017.     Allergies  Allergen Reactions  . Celexa [Citalopram] Anxiety    Immunization History  Administered Date(s) Administered  . Pneumococcal Polysaccharide-23 02/16/2015    Current Medications, Allergies, Past Medical History, Past Surgical History, Family History, and Social History were reviewed in [employer].   Review of Systems             All symptoms NEG except where BOLDED >>  Constitutional:  F/C/S, fatigue, anorexia, unexpected weight change. HEENT:  HA, visual changes, hearing loss, earache, nasal symptoms, sore throat, mouth sores, hoarseness. Resp:  cough, sputum, hemoptysis; SOB, tightness, wheezing. Cardio:  CP, palpit, DOE, orthopnea, edema. GI:  N/V/D/C, blood in stool; reflux, abd pain, distention, gas. GU:  dysuria, freq, urgency, hematuria, flank pain, voiding difficulty.  MS:  joint pain, swelling, tenderness, decr ROM; neck pain, back pain, etc. Neuro:  HA, tremors, seizures, dizziness, syncope, weakness, numbness, gait abn. Skin:  suspicious lesions or skin rash. Heme:  adenopathy, bruising, bleeding. Psyche:  confusion, agitation, sleep disturbance, hallucinations, anxiety,  depression suicidal.   Objective:   Physical Exam       Vital Signs:  Reviewed...   General:  WD, WN, 61 y/o WF in NAD; alert & oriented; pleasant & cooperative... HEENT:  [place]/AT; Conjunctiva- pink, Sclera- nonicteric, EOM-wnl, PERRLA, EACs-clear, TMs-wnl; NOSE-clear; THROAT-clear & wnl.  Neck:  Supple w/ full ROM; no JVD; normal carotid impulses w/o bruits; no thyromegaly or nodules palpated; no lymphadenopathy.  Chest:  Clear to P & A; without wheezes, rales, or rhonchi heard. Heart:  Regular Rhythm; norm S1 & S2 without murmurs, rubs, or gallops detected. Abdomen:  Soft & nontender- no guarding or rebound; normal bowel sounds; no organomegaly or masses palpated. Ext:  Normal ROM; without deformities or arthritic changes; no varicose veins, venous insuffic, or edema;  Pulses intact w/o bruits. Neuro:  No focal neuro deficits; sensory testing normal; gait normal & balance OK. Derm:  No lesions noted; no rash etc. Lymph:  No cervical, supraclavicular, axillary, or inguinal adenopathy palpated.   Assessment:      IMP >>     Dyspnea, chest tightness, & chest discomfort> I suspect a component from "chest wall musc spasm" to explain her symptomatology; this is multifactorial & we discussed trial :combination relaxer" like KLONOPIN 0.'5mg'$  Bid but she decided not to take this med; asked to try her shorter acting Alpraz prn to see if there is some improvement...     Hx lifelong asthma>  Mild intermittent uncomplicated w/ baseline CXR clear & wnl, plus baseline Spirometry wnl; under control w/ Advair100Bid & ProairHFA rescue inhaler prn...    Abn CT Chest 05/2015 w/ mild bilat apical pleuroparenchymal scarring & 27m LUL nodule that needs continued CT follow up...    Medical issues> Coronary calcif seen on CT Chest- takes ASA325 daily; Scoliosis seen on CXR; Hyperlipidemia- started on Lip20; Vit D defic on VitD supplement ~2000u daily; Hx anxiety & depression on Alpraz0.25 & EffexorXR75  PLAN >>    05/19/15>   RZyionnahas a lifelong hx of asthma but despite her intermittent difficulties she appears to have been well controlled and currently her CXR & Spirometry are WNL;  Her complaints of dyspnea & chest discomfort sounds to me to be reminiscent of "chest wall musc spasm" and we spent some time discussing this in the office today & I gave her a lot of reassurance;  In this regard- I have asked her to continue her Advair100Bid and Proair rescue inhaler prn, plus we are going to give her a trial of a "combination relaxer" like KLONOPIN 0.'5mg'$ - 1/2 to 1 tab Bid regularly over the next month;  With her family hx we are going to check a CT Chest for completeness, & we plan a brief recheck in about a month but she knows she may call upon me for any breathing issues in the interim 07/14/15>   We discussed the pros and cons of taking the Klonopin; in any event her dyspnea is mild and intermittent- she will continue on the Advair100Bid & use the Proair & Alpraz prn;  REC to f/u w/ me in 161yror recheck and f/u CT Chest to check the nodule.. 08/23/16>  RoMerisas clinically stable and asymptomatic- her asthma controlled on the Advair100 +  rescue inhaler prn (hasn't needed), her dyspnea improved as well- she remains active & has the Alprazolam to use prn; finally her CT Chest is similar in appearance to prev w/ tiny ~28m LUL nodule that we are following, Radiology hedged their bet w/ min enlargement felt to be ?real vs slice selection=> they rec continued f/u CT scans. 08/23/17>   IMP/PLAN>>  RCarleshais stable on her Advair100Bid & prn rescue inhaler; Rec to continue same & f/u in 655mos she remains concerned about the 28m72mUL nodule (no change on CT from time of 1st scan in Sep2016)...     Plan:       Medication List        Accurate as of 08/23/17  3:26 PM. Always use your most recent med list.          ADVAIR DISKUS 100-50 MCG/DOSE Aepb Generic drug:  Fluticasone-Salmeterol   ALPRAZolam 0.25 MG  tablet Commonly known as:  XANAX   atorvastatin 20 MG tablet Commonly known as:  LIPITOR   cholecalciferol 1000 units tablet Commonly known as:  VITAMIN D   MULTIVITAMIN ADULT PO   PROAIR HFA 108 (90 Base) MCG/ACT inhaler Generic drug:  albuterol   * venlafaxine XR 75 MG 24 hr capsule Commonly known as:  EFFEXOR-XR   * venlafaxine XR 37.5 MG 24 hr capsule Commonly known as:  EFFEXOR-XR      * This list has 2 medication(s) that are the same as other medications prescribed for you. Read the directions carefully, and ask your doctor or other care provider to review them with you.

## 2017-09-05 ENCOUNTER — Other Ambulatory Visit: Payer: Self-pay

## 2017-09-05 ENCOUNTER — Other Ambulatory Visit: Payer: Self-pay | Admitting: Family Medicine

## 2017-09-05 DIAGNOSIS — R10811 Right upper quadrant abdominal tenderness: Secondary | ICD-10-CM | POA: Diagnosis not present

## 2017-09-05 DIAGNOSIS — R110 Nausea: Secondary | ICD-10-CM | POA: Diagnosis not present

## 2017-09-06 ENCOUNTER — Ambulatory Visit: Payer: 59 | Admitting: Pulmonary Disease

## 2017-09-06 ENCOUNTER — Ambulatory Visit
Admission: RE | Admit: 2017-09-06 | Discharge: 2017-09-06 | Disposition: A | Discharge status: Home / Self Care | Payer: 59 | Source: Ambulatory Visit | Attending: Family Medicine | Admitting: Family Medicine

## 2017-09-06 DIAGNOSIS — R10811 Right upper quadrant abdominal tenderness: Secondary | ICD-10-CM

## 2017-09-06 DIAGNOSIS — R1010 Upper abdominal pain, unspecified: Secondary | ICD-10-CM | POA: Diagnosis not present

## 2018-01-16 ENCOUNTER — Other Ambulatory Visit: Payer: Self-pay | Admitting: Family Medicine

## 2018-01-16 DIAGNOSIS — Z1231 Encounter for screening mammogram for malignant neoplasm of breast: Secondary | ICD-10-CM

## 2018-02-07 ENCOUNTER — Ambulatory Visit
Admission: RE | Admit: 2018-02-07 | Discharge: 2018-02-07 | Disposition: A | Discharge status: Home / Self Care | Payer: 59 | Source: Ambulatory Visit | Attending: Family Medicine | Admitting: Family Medicine

## 2018-02-07 DIAGNOSIS — Z1231 Encounter for screening mammogram for malignant neoplasm of breast: Secondary | ICD-10-CM | POA: Diagnosis not present

## 2018-02-21 ENCOUNTER — Ambulatory Visit: Payer: 59 | Admitting: Pulmonary Disease

## 2018-02-27 ENCOUNTER — Ambulatory Visit: Payer: 59 | Admitting: Pulmonary Disease

## 2018-02-27 ENCOUNTER — Encounter: Payer: Self-pay | Admitting: Pulmonary Disease

## 2018-02-27 VITALS — BP 112/62 | HR 54 | Temp 98.2°F | Ht 67.0 in | Wt 166.8 lb

## 2018-02-27 DIAGNOSIS — J4520 Mild intermittent asthma, uncomplicated: Secondary | ICD-10-CM

## 2018-02-27 DIAGNOSIS — R911 Solitary pulmonary nodule: Secondary | ICD-10-CM

## 2018-02-27 MED ORDER — FLUTICASONE-SALMETEROL 250-50 MCG/DOSE IN AEPB: 1.0000 | INHALATION_SPRAY | Freq: Two times a day (BID) | RESPIRATORY_TRACT | 5 refills | Status: DC

## 2018-02-27 NOTE — Patient Instructions (Signed)
Today we updated your med list in our EPIC system...    Continue your current medications the same...  We decided to increase your ADVAIR from the 100/50 dose to the 250/50 dose-- still one inhalation twice daily...    Continue your PROAIR rescue inhaler as needed...  We discussed checking a follow up CT Chest to be absolutely sure there is no change in the tiny LUL lung nodule...    We will contact you w/ the results when available...   Call for any questions or if we can be of service in any way.Marland KitchenMarland Kitchen

## 2018-02-28 ENCOUNTER — Encounter: Payer: Self-pay | Admitting: Pulmonary Disease

## 2018-02-28 NOTE — Progress Notes (Signed)
Subjective:     Patient ID: Wendy [name], female   DOB: 1956/09/17, 62 y.o.   MRN: [id number]  HPI  ~  May 19, 2015:  Initial Pulmonary Consult by SN>>   8 y/o WF, referred by Dr. Kelton [name], Wendy [name] at [facility], for a pulmonary evaluation due to chest tightness and a hx of asthma>      Wendy [name] tells me that she has had Asthma ever since she was about 62 y/o when she had a bout of double pneumonia & told she had Asthma after that; as a child she recalls ER visits and shots w/ one other hosp around age 35 due to an asthma attack; she never used any inhalers until her teenage yrs when she started using OTC Primateen... In her adult life she notes occas episodes of bronchitis requiring antibiotics, and was started on Advair100 approx 10 yrs ago- initially used just one inhalation daily & recently incr to one inhalation Bid due to increased difficulty breathing & having to use her rescue inhaler (Proair) more often... She describes chest tightness ans some intermittent right chest discomfort but she actually denies wheezing, cough, sputum, hemoptysis, f/c/s, sinus symptoms, reflux, etc; her sensation of SOB is hard for her to characterize but she feels that it's more difficult to get the air "IN", not satisfied breathing, "not enough room for the air in her lungs"; she is active & walks ~54mn most days, denies DOE, does some light yard work but d[employer] Minor denies any progressive DOE...       She is a never smoker but was exposed to second hand smoke from her parents- Father died from small cell ca, and Mother has COPD & CHF; she believes that her paternal grandfather had silicosis (she says he was a well digger); she is employed in the IT Dept at [employer] any known occupational exposures over he lifetime...      She has a scratchy voice which she says occurs most mornings but denies sore throat, reflux symptoms, cough at night, hx of HH etc; she sleeps well, wakes refreshed, but enjoys an  afternoon nap & gets tired by 8PM; she denies being told about snoring, restless sleeping, etc; she denies any bad allergies and has a dog (scottish terrier) and 2 cats...      EXAM reveals Afeb, VSS, O2sat=95% on RA;  HEENT- neg, mallampati1;  Chest- clear w/o w/r/r;  Heart- RR w/o m/r/g;  Abd- soft, nontender, neg;  Ext- neg w/o c/c/e;  Neuro- intact...  CXR 05/19/15 showed norm heart size, clear lungs, scoliosis, NAD....   Spirometry 05/19/15 showed FVC=3.3 (94%), FEV1=2.6 (92%), %1sec=76, mid-flows are wnl at 89% of predicted...  CT Chest w/o contrast ordered & pending IMP/PLAN>>  [name] a lifelong hx of asthma but despite her intermittent difficulties she appears to have been well controlled and currently her CXR & Spirometry are WNL;  Her complaints of dyspnea & chest discomfort sounds to me to be reminiscent of "chest wall musc spasm" and we spent some time discussing this in the office today & I gave her a lot of reassurance;  In this regard- I have asked her to continue her Advair100Bid and Proair rescue inhaler prn, plus we are going to give her a trial of a "combination relaxer" like KLONOPIN 0.'5mg'$ - 1/2 to 1 tab Bid regularly over the next month;  With her family hx we are going to check a CT Chest for completeness, & we plan a brief recheck  in about a month but she knows she may call upon me for any breathing issues in the interim...  ADDENDUM>>  CT Chest 05/26/15 showed mild bilat apical pleuroparenchymal scarring w/ 33m left apical nodule (likely scar tissue), no adenopathy; incidental findings include: coronary calcif & poss gallstone-  I called pt w/ report... SMN    ~  July 14, 2015:  272moOV w/ SN>  [name] that she filled the presciption for the Klonopin but decided NOT to take it; notes that her breathing is about the same on her Advair100-Bid & Proair rescue inhaler prn;  In the interim she tells me that [name] (?due to calcif  seen on CT Chest?) but we do not have any access to his notes or eval and she is asked to get notes sent to usKoreao scan into Epic...  We reviewed the following medical problems during today's office visit >>     Dyspnea>  This is multifactorial & we discussed trial :combination relaxer" like KLONOPIN 0.104m12mid but she decided not to take this med; asked to try her shorter acting Alpraz prn to see if there is some improvement...     Hx lifelong asthma>  Mild intermittent uncomplicated w/ baseline CXR clear & wnl, plus baseline Spirometry wnl; under control w/ Advair100Bid & ProairHFA rescue inhaler prn...    Abn CT Chest 05/2015 w/ mild bilat apical pleuroparenchymal scarring & 104mm64mL nodule that needs f/u 48yr 102yredical issues> Coronary calcif seen on CT Chest- takes ASA325 daily; Scoliosis seen on CXR; Hyperlipidemia- started on Lip20; Vit D defic on VitD supplement ~2000u daily; Hx anxiety & depression on Alpraz0.25 & EffexorXR75 EXAM reveals Afeb, VSS, O2sat=98% on RA;  HEENT- neg, mallampati1;  Chest- clear w/o w/r/r;  Heart- RR w/o m/r/g;  Abd- soft, nontender, neg;  Ext- neg w/o c/c/e;  Neuro- intact... IMP/PLAN>>  We discussed the pros and cons of taking the Klonopin; in any event her dyspnea is mild and intermittent- she will continue on the Advair100Bid & use the Proair & Alpraz prn;  REC to f/u w/ me in 48yr f49yrecheck and f/u CT Chest to check the nodule...   ~  August 23, 2016:  14mo R32mo SN>  Wendy [name] rNilzas feeling great and denies any cardio-resp symptoms- no cough, sput, hemoptysis, CP, SOB, wheezing/ tightness, etc;  She denies CP/ angina, palpit, dizzy, edema, etc; no f/c/s...  She remains on Advair100bid & Proair rescue (hasn't needed) but she tells me that her organic black elderberry syrup has really helped her resp issues... We reviewed the following medical problems during today's office visit>     Dyspnea>  This is multifactorial & we discussed trial :combination relaxer" like  KLONOPIN 0.104mg Bid16mt she preferred to use her Xanax 0.25 prn...    Hx lifelong asthma>  Mild intermittent uncomplicated w/ baseline CXR clear & wnl, plus baseline Spirometry wnl; under control w/ Advair100Bid & ProairHFA rescue inhaler prn...    Abn CT Chest 05/2015 w/ mild bilat apical pleuroparenchymal scarring & 104mm LUL 39mule that needs f/u CT Chest now...    Medical issues> Coronary calcif seen on CT Chest- takes ASA325 daily; Scoliosis seen on CXR; Hyperlipidemia- started on Lip20; Vit D defic on VitD supplement ~2000u daily; Hx anxiety & depression on Alpraz0.25 & EffexorXR75 EXAM reveals Afeb, VSS, O2sat=94% on RA;  HEENT- neg, mallampati1;  Chest- clear w/o w/r/r;  Heart- RR w/o m/r/g;  Abd- soft, nontender, neg;  Ext- neg w/o c/c/e;  Neuro- intact...  CT Chest 08/24/16>  Norm heart size w/ coronary calcif seen; no enlarged mediastinal nodes; left apical nodule measures 5.5x4.75m (sl larger) but it is unchanged at 5x698mon the coronal images; mild apical scarring, no new lesions etc; Radiology feels that the diff could be from slice selection & they rec f/u CT in 6 months... Pt notified & she will ret in 47m247mor recheck.  IMP/PLAN>>  RobEstha clinically stable and asymptomatic- her asthma controlled on the Advair100 + rescue inhaler prn (hasn't needed), her dyspnea improved as well- she remains active & has the Alprazolam to use prn; finally her CT Chest is similar in appearance to prev w/ tiny ~5mm52mL nodule that we are following, Radiology hedged their bet w/ min enlargement felt to be ?real vs slice selection=> they rec continued f/u CT scans...  ~  March 07, 2017:  47mo 68mo& Jaley notes that the hot humid weather effects her breathing  & she's been using the rescue inhaler ~2x/d;  Asked to continue the Advair100bid regularly & plan to exercise indoors at gym etc;  She remains quite concerned about the tiny 5mm L80mnodule and wants another CT scan at this time despite the fact that it really  hadn't changed from 9/16 to 12/17;  She has Xanax 0.'25mg'$  to help w/ anxiety & dyspnea, and she takes Black Elderberry syrup to help her immune system, fight infections, and congestion...    We reviewed the following medical problems during today's office visit >> Her PCP is Dr. ElaineKelton Pillarspnea>  This is multifactorial & we discussed trial :combination relaxer" like KLONOPIN 0.'5mg'$  Bid but she preferred to use her Xanax 0.25 prn...    Hx lifelong asthma>  Mild intermittent uncomplicated w/ baseline CXR clear & wnl, plus baseline Spirometry wnl; under control w/ Advair100Bid & ProairHFA rescue inhaler prn...    Abn CT Chest 05/2015 w/ mild bilat apical pleuroparenchymal scarring & 5mm LU67module => serial f/u CT Chest scans without growth in this lesion, she remains concerned & we continue to follow...    Medical issues> Coronary calcif seen on CT Chest- takes ASA325 daily; Scoliosis seen on CXR; Hyperlipidemia- started on Lip20; Vit D defic on VitD supplement ~2000u daily; Hx anxiety & depression on Alpraz0.25 & EffexorXR75 EXAM reveals Afeb, VSS, O2sat=94% on RA;  HEENT- neg, mallampati1;  Chest- clear w/o w/r/r;  Heart- RR w/o m/r/g;  Abd- soft, nontender, neg;  Ext- neg w/o c/c/e;  Neuro- intact...  CT Chest 03/08/17>  Norm heart size, aortic atherosclerosis, coronary calcif in LAD; no adenopathy, LUL pulm nodule measures 47mm & n35msignif changed from 9/16 or 12/17; no new lesions; scoliosis is ident... IMP/PLAN>>  Wendy [name] [place] clinically stable but also concerned about this LUL lesion, wants repeat f/u CT in 67yr;  We467yriewed continued Advair, AlbutHFA meds, plus exercise program...   ~  August 23, 2017:  6 month ROV & pulmonary follow up visit>  Wendy [name] repMelviniaa good interval- breathing well w/ min intermittent cough, no sput, no hemoptysis, & chr stable DOE w/o change; she uses black elderberry for congestion & says that this helps; she refuses the Flu vaccine...     We reviewed the  following medical problems during today's office visit >> Her PCP is Dr. Elaine GrKelton Pillarea>  This is multifactorial & we discussed trial :combination relaxer" like KLONOPIN 0.'5mg'$  Bid but  she preferred to use her Xanax 0.25 prn...    Hx lifelong asthma>  Mild intermittent uncomplicated w/ baseline CXR clear & wnl, plus baseline Spirometry wnl; under control w/ Advair100Bid & ProairHFA rescue inhaler prn...    Abn CT Chest 05/2015 w/ mild bilat apical pleuroparenchymal scarring & 70m LUL nodule => serial f/u CT Chest scans without growth in this lesion, she remains concerned & we continue to follow...    Medical issues> Coronary calcif seen on CT Chest- takes ASA325 daily; Scoliosis seen on CXR; Hyperlipidemia- started on Lip20; Vit D defic on VitD supplement ~2000u daily; Hx anxiety & depression on Alpraz0.25 & EffexorXR75 EXAM reveals Afeb, VSS, O2sat=95% on RA;  HEENT- neg, mallampati1;  Chest- clear w/o w/r/r;  Heart- RR w/o m/r/g;  Abd- soft, nontender, neg;  Ext- neg w/o c/c/e;  Neuro- intact... IMP/PLAN>>  RChasityis stable on her Advair100Bid & prn rescue inhaler; Rec to continue same & f/u in 663mos she remains concerned about the 54m31mUL nodule (no change on CT from time of 1st scan in Sep2016)...   ~  February 27, 2018:  54mo69mo & pulmonary follow up visit>        Past Medical History  Diagnosis Date  . Asthma    Hx UTIs    Scoliosis    Hx Vit D defic >> on Vit D 2000u daily and calcium + MVI    Hx anxiety & depression >> on Alprazolam 0.25 prn & EffexorXR75    Coronary calcif seen on CT Chest>> [facility]    Hyperlipidemia >> started on ASA '325mg'$  & Lipitor '20mg'$  by [facility]...     History reviewed. No pertinent surgical history. Removal of basal cell cancer from right clavicular area 2015   Outpatient Encounter Medications as of 02/27/2018  Medication Sig  . ADVAIR DISKUS 100-50 MCG/DOSE AEPB Take 1 puff by mouth 2 (two) times daily.  .  AMarland KitchenPRAZolam (XANAX) 0.25 MG tablet Take 0.25 mg by mouth daily as needed for anxiety.  . atMarland Kitchenrvastatin (LIPITOR) 20 MG tablet Take 20 mg by mouth daily.  . cholecalciferol (VITAMIN D) 1000 UNITS tablet Take 2,000 Units by mouth daily.  . Multiple Vitamins-Minerals (MULTIVITAMIN ADULT PO) Take 6 tablets by mouth daily.  . PRMarland KitchenAIR HFA 108 (90 BASE) MCG/ACT inhaler Take 2 puffs by mouth every 4 (four) hours as needed.  . venlafaxine XR (EFFEXOR-XR) 37.5 MG 24 hr capsule Take 37.5 mg by mouth daily. Take with '75mg'$  venlafaxine  . venlafaxine XR (EFFEXOR-XR) 75 MG 24 hr capsule 112.5 mg daily.   . Fluticasone-Salmeterol (ADVAIR DISKUS) 250-50 MCG/DOSE AEPB Inhale 1 puff into the lungs 2 (two) times daily.  . [DISCONTINUED] Fluticasone-Salmeterol (ADVAIR DISKUS) 250-50 MCG/DOSE AEPB Inhale 1 puff into the lungs 2 (two) times daily.   No facility-administered encounter medications on file as of 02/27/2018.     Allergies  Allergen Reactions  . Celexa [Citalopram] Anxiety    Immunization History  Administered Date(s) Administered  . Pneumococcal Polysaccharide-23 02/16/2015    Current Medications, Allergies, Past Medical History, Past Surgical History, Family History, and Social History were reviewed in [employer].   Review of Systems             All symptoms NEG except where BOLDED >>  Constitutional:  F/C/S, fatigue, anorexia, unexpected weight change. HEENT:  HA, visual changes, hearing loss, earache, nasal symptoms, sore throat, mouth sores, hoarseness. Resp:  cough, sputum, hemoptysis; SOB, tightness,  wheezing. Cardio:  CP, palpit, DOE, orthopnea, edema. GI:  N/V/D/C, blood in stool; reflux, abd pain, distention, gas. GU:  dysuria, freq, urgency, hematuria, flank pain, voiding difficulty. MS:  joint pain, swelling, tenderness, decr ROM; neck pain, back pain, etc. Neuro:  HA, tremors, seizures, dizziness, syncope, weakness, numbness, gait abn. Skin:   suspicious lesions or skin rash. Heme:  adenopathy, bruising, bleeding. Psyche:  confusion, agitation, sleep disturbance, hallucinations, anxiety, depression suicidal.   Objective:   Physical Exam       Vital Signs:  Reviewed...   General:  WD, WN, 62 y/o WF in NAD; alert & oriented; pleasant & cooperative... HEENT:  [place]/AT; Conjunctiva- pink, Sclera- nonicteric, EOM-wnl, PERRLA, EACs-clear, TMs-wnl; NOSE-clear; THROAT-clear & wnl.  Neck:  Supple w/ full ROM; no JVD; normal carotid impulses w/o bruits; no thyromegaly or nodules palpated; no lymphadenopathy.  Chest:  Clear to P & A; without wheezes, rales, or rhonchi heard. Heart:  Regular Rhythm; norm S1 & S2 without murmurs, rubs, or gallops detected. Abdomen:  Soft & nontender- no guarding or rebound; normal bowel sounds; no organomegaly or masses palpated. Ext:  Normal ROM; without deformities or arthritic changes; no varicose veins, venous insuffic, or edema;  Pulses intact w/o bruits. Neuro:  No focal neuro deficits; sensory testing normal; gait normal & balance OK. Derm:  No lesions noted; no rash etc. Lymph:  No cervical, supraclavicular, axillary, or inguinal adenopathy palpated.   Assessment:      IMP >>     Dyspnea, chest tightness, & chest discomfort> I suspect a component from "chest wall musc spasm" to explain her symptomatology; this is multifactorial & we discussed trial :combination relaxer" like KLONOPIN 0.'5mg'$  Bid but she decided not to take this med; asked to try her shorter acting Alpraz prn to see if there is some improvement...     Hx lifelong asthma>  Mild intermittent uncomplicated w/ baseline CXR clear & wnl, plus baseline Spirometry wnl; under control w/ Advair100Bid & ProairHFA rescue inhaler prn...    Abn CT Chest 05/2015 w/ mild bilat apical pleuroparenchymal scarring & 29m LUL nodule that needs continued CT follow up...    Medical issues> Coronary calcif seen on CT Chest- takes ASA325 daily; Scoliosis seen on  CXR; Hyperlipidemia- started on Lip20; Vit D defic on VitD supplement ~2000u daily; Hx anxiety & depression on Alpraz0.25 & EffexorXR75  PLAN >>  05/19/15>   RDaytonahas a lifelong hx of asthma but despite her intermittent difficulties she appears to have been well controlled and currently her CXR & Spirometry are WNL;  Her complaints of dyspnea & chest discomfort sounds to me to be reminiscent of "chest wall musc spasm" and we spent some time discussing this in the office today & I gave her a lot of reassurance;  In this regard- I have asked her to continue her Advair100Bid and Proair rescue inhaler prn, plus we are going to give her a trial of a "combination relaxer" like KLONOPIN 0.'5mg'$ - 1/2 to 1 tab Bid regularly over the next month;  With her family hx we are going to check a CT Chest for completeness, & we plan a brief recheck in about a month but she knows she may call upon me for any breathing issues in the interim 07/14/15>   We discussed the pros and cons of taking the Klonopin; in any event her dyspnea is mild and intermittent- she will continue on the Advair100Bid & use the Proair & Alpraz prn;  REC to f/u w/  me in 33yrfor recheck and f/u CT Chest to check the nodule.. 08/23/16>  Wendy [name] clinically stable and asymptomatic- her asthma controlled on the Advair100 + rescue inhaler prn (hasn't needed), her dyspnea improved as well- she remains active & has the Alprazolam to use prn; finally her CT Chest is similar in appearance to prev w/ tiny ~545mLUL nodule that we are following, Radiology hedged their bet w/ min enlargement felt to be ?real vs slice selection=> they rec continued f/u CT scans. 08/23/17>   IMP/PLAN>>  Wendy [name] stable on her Advair100Bid & prn rescue inhaler; Rec to continue same & f/u in 29m54mo she remains concerned about the 29mm77mL nodule (no change on CT from time of 1st scan in Sep2016)...     Plan:     Patient's Medications  New Prescriptions   FLUTICASONE-SALMETEROL (ADVAIR  DISKUS) 250-50 MCG/DOSE AEPB    Inhale 1 puff into the lungs 2 (two) times daily.  Previous Medications    ALPRAZOLAM (XANAX) 0.25 MG TABLET    Take 0.25 mg by mouth daily as needed for anxiety.   ATORVASTATIN (LIPITOR) 20 MG TABLET    Take 20 mg by mouth daily.   CHOLECALCIFEROL (VITAMIN D) 1000 UNITS TABLET    Take 2,000 Units by mouth daily.   MULTIPLE VITAMINS-MINERALS (MULTIVITAMIN ADULT PO)    Take 6 tablets by mouth daily.   PROAIR HFA 108 (90 BASE) MCG/ACT INHALER    Take 2 puffs by mouth every 4 (four) hours as needed.   VENLAFAXINE XR (EFFEXOR-XR) 37.5 MG 24 HR CAPSULE    Take 37.5 mg by mouth daily. Take with '75mg'$  venlafaxine   VENLAFAXINE XR (EFFEXOR-XR) 75 MG 24 HR CAPSULE    112.5 mg daily.   Modified Medications   No medications on file  Discontinued Medications   No medications on file

## 2018-03-11 ENCOUNTER — Other Ambulatory Visit: Payer: 59

## 2018-03-12 ENCOUNTER — Ambulatory Visit (INDEPENDENT_AMBULATORY_CARE_PROVIDER_SITE_OTHER)
Admission: RE | Admit: 2018-03-12 | Discharge: 2018-03-12 | Disposition: A | Discharge status: Home / Self Care | Payer: 59 | Source: Ambulatory Visit | Attending: Pulmonary Disease | Admitting: Pulmonary Disease

## 2018-03-12 DIAGNOSIS — R911 Solitary pulmonary nodule: Secondary | ICD-10-CM | POA: Diagnosis not present

## 2018-05-16 DIAGNOSIS — E7800 Pure hypercholesterolemia, unspecified: Secondary | ICD-10-CM | POA: Diagnosis not present

## 2018-05-16 DIAGNOSIS — Z0000 Encounter for general adult medical examination without abnormal findings: Secondary | ICD-10-CM | POA: Diagnosis not present

## 2018-05-16 DIAGNOSIS — R7303 Prediabetes: Secondary | ICD-10-CM | POA: Diagnosis not present

## 2018-06-26 DIAGNOSIS — M8588 Other specified disorders of bone density and structure, other site: Secondary | ICD-10-CM | POA: Diagnosis not present

## 2018-08-08 DIAGNOSIS — L814 Other melanin hyperpigmentation: Secondary | ICD-10-CM | POA: Diagnosis not present

## 2018-08-08 DIAGNOSIS — L821 Other seborrheic keratosis: Secondary | ICD-10-CM | POA: Diagnosis not present

## 2018-08-08 DIAGNOSIS — Z85828 Personal history of other malignant neoplasm of skin: Secondary | ICD-10-CM | POA: Diagnosis not present

## 2018-10-02 DIAGNOSIS — F410 Panic disorder [episodic paroxysmal anxiety] without agoraphobia: Secondary | ICD-10-CM | POA: Diagnosis not present

## 2018-12-04 DIAGNOSIS — F410 Panic disorder [episodic paroxysmal anxiety] without agoraphobia: Secondary | ICD-10-CM | POA: Diagnosis not present

## 2019-01-06 ENCOUNTER — Other Ambulatory Visit: Payer: Self-pay | Admitting: Family Medicine

## 2019-01-06 DIAGNOSIS — Z1231 Encounter for screening mammogram for malignant neoplasm of breast: Secondary | ICD-10-CM

## 2019-01-15 DIAGNOSIS — F410 Panic disorder [episodic paroxysmal anxiety] without agoraphobia: Secondary | ICD-10-CM | POA: Diagnosis not present

## 2019-01-16 DIAGNOSIS — F3342 Major depressive disorder, recurrent, in full remission: Secondary | ICD-10-CM | POA: Diagnosis not present

## 2019-01-16 DIAGNOSIS — F411 Generalized anxiety disorder: Secondary | ICD-10-CM | POA: Diagnosis not present

## 2019-01-31 ENCOUNTER — Other Ambulatory Visit: Payer: Self-pay | Admitting: Pulmonary Disease

## 2019-02-18 DIAGNOSIS — F410 Panic disorder [episodic paroxysmal anxiety] without agoraphobia: Secondary | ICD-10-CM | POA: Diagnosis not present

## 2019-03-06 ENCOUNTER — Ambulatory Visit: Payer: 59

## 2019-04-02 ENCOUNTER — Other Ambulatory Visit: Payer: Self-pay

## 2019-04-02 ENCOUNTER — Ambulatory Visit
Admission: RE | Admit: 2019-04-02 | Discharge: 2019-04-02 | Disposition: A | Discharge status: Home / Self Care | Payer: BC Managed Care – PPO | Source: Ambulatory Visit | Attending: Family Medicine | Admitting: Family Medicine

## 2019-04-02 DIAGNOSIS — Z1231 Encounter for screening mammogram for malignant neoplasm of breast: Secondary | ICD-10-CM

## 2019-05-21 DIAGNOSIS — Z0000 Encounter for general adult medical examination without abnormal findings: Secondary | ICD-10-CM | POA: Diagnosis not present

## 2019-05-22 DIAGNOSIS — R7303 Prediabetes: Secondary | ICD-10-CM | POA: Diagnosis not present

## 2019-05-22 DIAGNOSIS — E7800 Pure hypercholesterolemia, unspecified: Secondary | ICD-10-CM | POA: Diagnosis not present

## 2019-07-16 ENCOUNTER — Other Ambulatory Visit: Payer: Self-pay | Admitting: Pulmonary Disease

## 2019-08-01 ENCOUNTER — Other Ambulatory Visit: Payer: Self-pay | Admitting: Pulmonary Disease

## 2019-12-11 ENCOUNTER — Other Ambulatory Visit: Payer: Self-pay | Admitting: *Deleted

## 2019-12-26 ENCOUNTER — Ambulatory Visit: Payer: Self-pay | Attending: Internal Medicine

## 2019-12-26 ENCOUNTER — Ambulatory Visit: Payer: BC Managed Care – PPO

## 2019-12-26 DIAGNOSIS — Z23 Encounter for immunization: Secondary | ICD-10-CM

## 2019-12-26 NOTE — Progress Notes (Signed)
   Covid-19 Vaccination Clinic  Name:  Wendy [name]    MRN: [id number] DOB: 1956-07-13  12/26/2019  [name] was observed post Covid-19 immunization for 15 minutes without incident. She was provided with Vaccine Information Sheet and instruction to access the V-Safe system.   [name] was instructed to call 911 with any severe reactions post vaccine: Marland Kitchen Difficulty breathing  . Swelling of face and throat  . A fast heartbeat  . A bad rash all over body  . Dizziness and weakness   Immunizations Administered    Name Date Dose VIS Date Route   Pfizer COVID-19 Vaccine 12/26/2019  2:04 PM 0.3 mL 08/29/2019 Intramuscular   Manufacturer: [place]   Lot: [id number]   [place]: [id number]

## 2019-12-30 ENCOUNTER — Telehealth: Payer: Self-pay | Admitting: Pulmonary Disease

## 2019-12-30 NOTE — Telephone Encounter (Signed)
Called [facility], spoke with Aaron Edelman, Pharmacist.  Aaron Edelman stated a new prescription for Advair is needed from Dr. Lenna Gilford. Dr. Lenna Gilford retired 08/2018, and Patient needs to get re established with new provider for future refills. Called and spoke with Patient. Patient made aware of Dr. Jeannine Kitten retirement, and need for OV for further Advair refills. Offered to schedule OV with new provider.  Patient declined and stated she would follow up with PCP, for Advair refills. Nothing further at this time.

## 2020-01-08 DIAGNOSIS — F4322 Adjustment disorder with anxiety: Secondary | ICD-10-CM | POA: Diagnosis not present

## 2020-01-21 ENCOUNTER — Ambulatory Visit: Payer: Self-pay | Attending: Internal Medicine

## 2020-01-21 DIAGNOSIS — Z23 Encounter for immunization: Secondary | ICD-10-CM

## 2020-01-21 NOTE — Progress Notes (Signed)
   Covid-19 Vaccination Clinic  Name:  Wendy [name]    MRN: [id number] DOB: 1956/01/22  01/21/2020  [name] was observed post Covid-19 immunization for 15 minutes without incident. She was provided with Vaccine Information Sheet and instruction to access the V-Safe system.   [name] was instructed to call 911 with any severe reactions post vaccine: Marland Kitchen Difficulty breathing  . Swelling of face and throat  . A fast heartbeat  . A bad rash all over body  . Dizziness and weakness   Immunizations Administered    Name Date Dose VIS Date Route   Pfizer COVID-19 Vaccine 01/21/2020  2:02 PM 0.3 mL 11/12/2018 Intramuscular   Manufacturer: [place]   Lot: [id number]   [place]: [id number]

## 2020-03-01 ENCOUNTER — Other Ambulatory Visit: Payer: Self-pay | Admitting: Family Medicine

## 2020-03-01 DIAGNOSIS — Z1231 Encounter for screening mammogram for malignant neoplasm of breast: Secondary | ICD-10-CM

## 2020-04-02 ENCOUNTER — Other Ambulatory Visit: Payer: Self-pay

## 2020-04-02 ENCOUNTER — Ambulatory Visit
Admission: RE | Admit: 2020-04-02 | Discharge: 2020-04-02 | Disposition: A | Discharge status: Home / Self Care | Payer: BC Managed Care – PPO | Source: Ambulatory Visit | Attending: Family Medicine | Admitting: Family Medicine

## 2020-04-02 DIAGNOSIS — Z1231 Encounter for screening mammogram for malignant neoplasm of breast: Secondary | ICD-10-CM

## 2020-05-28 DIAGNOSIS — Z0000 Encounter for general adult medical examination without abnormal findings: Secondary | ICD-10-CM | POA: Diagnosis not present

## 2020-05-28 DIAGNOSIS — R7303 Prediabetes: Secondary | ICD-10-CM | POA: Diagnosis not present

## 2020-05-28 DIAGNOSIS — E7800 Pure hypercholesterolemia, unspecified: Secondary | ICD-10-CM | POA: Diagnosis not present

## 2020-06-10 DIAGNOSIS — M545 Low back pain: Secondary | ICD-10-CM | POA: Diagnosis not present

## 2020-06-10 DIAGNOSIS — M419 Scoliosis, unspecified: Secondary | ICD-10-CM | POA: Diagnosis not present

## 2020-08-30 ENCOUNTER — Ambulatory Visit: Payer: Self-pay | Attending: Internal Medicine

## 2020-08-30 DIAGNOSIS — Z23 Encounter for immunization: Secondary | ICD-10-CM

## 2020-08-30 NOTE — Progress Notes (Signed)
   Covid-19 Vaccination Clinic  Name:  Wendy [name]    MRN: [id number] DOB: 05/13/1956  08/30/2020  Wendy [name] was observed post Covid-19 immunization for 15 minutes without incident. She was provided with Vaccine Information Sheet and instruction to access the V-Safe system.   Wendy [name] was instructed to call 911 with any severe reactions post vaccine: Marland Kitchen Difficulty breathing  . Swelling of face and throat  . A fast heartbeat  . A bad rash all over body  . Dizziness and weakness   Immunizations Administered    Name Date Dose VIS Date Route   Pfizer COVID-19 Vaccine 08/30/2020  2:23 PM 0.3 mL 07/07/2020 Intramuscular   Manufacturer: [place]   Lot: [id number]   [place]: [id number]

## 2020-12-18 ENCOUNTER — Emergency Department (HOSPITAL_COMMUNITY): Payer: 59

## 2020-12-18 ENCOUNTER — Other Ambulatory Visit: Payer: Self-pay

## 2020-12-18 ENCOUNTER — Encounter (HOSPITAL_COMMUNITY): Payer: Self-pay

## 2020-12-18 ENCOUNTER — Inpatient Hospital Stay (HOSPITAL_COMMUNITY)
Admission: EM | Admit: 2020-12-18 | Discharge: 2020-12-23 | DRG: 964 | Disposition: A | Discharge status: Home / Self Care | Payer: 59 | Attending: General Surgery | Admitting: General Surgery

## 2020-12-18 ENCOUNTER — Inpatient Hospital Stay (HOSPITAL_COMMUNITY): Payer: 59

## 2020-12-18 DIAGNOSIS — S270XXA Traumatic pneumothorax, initial encounter: Secondary | ICD-10-CM | POA: Diagnosis present

## 2020-12-18 DIAGNOSIS — S2242XA Multiple fractures of ribs, left side, initial encounter for closed fracture: Secondary | ICD-10-CM | POA: Diagnosis present

## 2020-12-18 DIAGNOSIS — Z7951 Long term (current) use of inhaled steroids: Secondary | ICD-10-CM | POA: Diagnosis not present

## 2020-12-18 DIAGNOSIS — S36032A Major laceration of spleen, initial encounter: Principal | ICD-10-CM | POA: Diagnosis present

## 2020-12-18 DIAGNOSIS — Z79899 Other long term (current) drug therapy: Secondary | ICD-10-CM | POA: Diagnosis not present

## 2020-12-18 DIAGNOSIS — Z825 Family history of asthma and other chronic lower respiratory diseases: Secondary | ICD-10-CM

## 2020-12-18 DIAGNOSIS — R0602 Shortness of breath: Secondary | ICD-10-CM

## 2020-12-18 DIAGNOSIS — I959 Hypotension, unspecified: Secondary | ICD-10-CM | POA: Diagnosis present

## 2020-12-18 DIAGNOSIS — Z20822 Contact with and (suspected) exposure to covid-19: Secondary | ICD-10-CM | POA: Diagnosis present

## 2020-12-18 DIAGNOSIS — D62 Acute posthemorrhagic anemia: Secondary | ICD-10-CM | POA: Diagnosis present

## 2020-12-18 DIAGNOSIS — J45909 Unspecified asthma, uncomplicated: Secondary | ICD-10-CM | POA: Diagnosis present

## 2020-12-18 DIAGNOSIS — Z888 Allergy status to other drugs, medicaments and biological substances status: Secondary | ICD-10-CM

## 2020-12-18 DIAGNOSIS — S3609XA Other injury of spleen, initial encounter: Secondary | ICD-10-CM | POA: Diagnosis present

## 2020-12-18 DIAGNOSIS — Y92410 Unspecified street and highway as the place of occurrence of the external cause: Secondary | ICD-10-CM

## 2020-12-18 DIAGNOSIS — S36039A Unspecified laceration of spleen, initial encounter: Secondary | ICD-10-CM

## 2020-12-18 DIAGNOSIS — R109 Unspecified abdominal pain: Secondary | ICD-10-CM | POA: Diagnosis not present

## 2020-12-18 DIAGNOSIS — J939 Pneumothorax, unspecified: Secondary | ICD-10-CM

## 2020-12-18 LAB — URINALYSIS, ROUTINE W REFLEX MICROSCOPIC
Bilirubin Urine: NEGATIVE
Glucose, UA: NEGATIVE mg/dL
Hgb urine dipstick: NEGATIVE
Ketones, ur: 20 mg/dL — AB
Leukocytes,Ua: NEGATIVE
Nitrite: NEGATIVE
Protein, ur: NEGATIVE mg/dL
Specific Gravity, Urine: >1.046 — ABNORMAL HIGH (ref 1.005–1.030)
pH: 7 (ref 5.0–8.0)

## 2020-12-18 LAB — TYPE AND SCREEN
ABO/RH(D): O POS
Antibody Screen: NEGATIVE

## 2020-12-18 LAB — CBC
HCT: 38.1 % (ref 36.0–46.0)
HCT: 35.5 % — ABNORMAL LOW (ref 36.0–46.0)
HCT: 37.5 % (ref 36.0–46.0)
Hemoglobin: 12.3 g/dL (ref 12.0–15.0)
Hemoglobin: 11.6 g/dL — ABNORMAL LOW (ref 12.0–15.0)
Hemoglobin: 11.9 g/dL — ABNORMAL LOW (ref 12.0–15.0)
MCH: 31.5 pg (ref 26.0–34.0)
MCH: 31.6 pg (ref 26.0–34.0)
MCH: 30.6 pg (ref 26.0–34.0)
MCHC: 32.3 g/dL (ref 30.0–36.0)
MCHC: 32.7 g/dL (ref 30.0–36.0)
MCHC: 31.7 g/dL (ref 30.0–36.0)
MCV: 97.7 fL (ref 80.0–100.0)
MCV: 96.7 fL (ref 80.0–100.0)
MCV: 96.4 fL (ref 80.0–100.0)
Platelets: 208 10*3/uL (ref 150–400)
Platelets: 181 10*3/uL (ref 150–400)
Platelets: 157 10*3/uL (ref 150–400)
RBC: 3.9 MIL/uL (ref 3.87–5.11)
RBC: 3.67 MIL/uL — ABNORMAL LOW (ref 3.87–5.11)
RBC: 3.89 MIL/uL (ref 3.87–5.11)
RDW: 12.7 % (ref 11.5–15.5)
RDW: 12.6 % (ref 11.5–15.5)
RDW: 12.6 % (ref 11.5–15.5)
WBC: 6.5 10*3/uL (ref 4.0–10.5)
WBC: 13.1 10*3/uL — ABNORMAL HIGH (ref 4.0–10.5)
WBC: 10.4 10*3/uL (ref 4.0–10.5)
nRBC: 0 % (ref 0.0–0.2)
nRBC: 0 % (ref 0.0–0.2)
nRBC: 0 % (ref 0.0–0.2)

## 2020-12-18 LAB — COMPREHENSIVE METABOLIC PANEL
ALT: 25 U/L (ref 0–44)
AST: 34 U/L (ref 15–41)
Albumin: 3.6 g/dL (ref 3.5–5.0)
Alkaline Phosphatase: 63 U/L (ref 38–126)
Anion gap: 11 (ref 5–15)
BUN: 9 mg/dL (ref 8–23)
CO2: 21 mmol/L — ABNORMAL LOW (ref 22–32)
Calcium: 9 mg/dL (ref 8.9–10.3)
Chloride: 105 mmol/L (ref 98–111)
Creatinine, Ser: 0.67 mg/dL (ref 0.44–1.00)
GFR, Estimated: >60 mL/min (ref >60)
Glucose, Bld: 149 mg/dL — ABNORMAL HIGH (ref 70–99)
Potassium: 3.9 mmol/L (ref 3.5–5.1)
Sodium: 137 mmol/L (ref 135–145)
Total Bilirubin: 0.6 mg/dL (ref 0.3–1.2)
Total Protein: 5.9 g/dL — ABNORMAL LOW (ref 6.5–8.1)

## 2020-12-18 LAB — PREPARE RBC (CROSSMATCH)

## 2020-12-18 LAB — I-STAT CHEM 8, ED
BUN: 11 mg/dL (ref 8–23)
Calcium, Ion: 1.02 mmol/L — ABNORMAL LOW (ref 1.15–1.40)
Chloride: 105 mmol/L (ref 98–111)
Creatinine, Ser: 0.5 mg/dL (ref 0.44–1.00)
Glucose, Bld: 143 mg/dL — ABNORMAL HIGH (ref 70–99)
HCT: 36 % (ref 36.0–46.0)
Hemoglobin: 12.2 g/dL (ref 12.0–15.0)
Potassium: 4 mmol/L (ref 3.5–5.1)
Sodium: 139 mmol/L (ref 135–145)
TCO2: 23 mmol/L (ref 22–32)

## 2020-12-18 LAB — RESP PANEL BY RT-PCR (FLU A&B, COVID) ARPGX2
Influenza A by PCR: NEGATIVE
Influenza B by PCR: NEGATIVE
SARS Coronavirus 2 by RT PCR: NEGATIVE

## 2020-12-18 LAB — PROTIME-INR
INR: 1 (ref 0.8–1.2)
INR: 1 (ref 0.8–1.2)
Prothrombin Time: 12.5 seconds (ref 11.4–15.2)
Prothrombin Time: 12.6 s (ref 11.4–15.2)

## 2020-12-18 LAB — LACTIC ACID, PLASMA: Lactic Acid, Venous: 2.6 mmol/L (ref 0.5–1.9)

## 2020-12-18 LAB — HIV ANTIBODY (ROUTINE TESTING W REFLEX): HIV Screen 4th Generation wRfx: NONREACTIVE

## 2020-12-18 LAB — SAMPLE TO BLOOD BANK

## 2020-12-18 LAB — ABO/RH: ABO/RH(D): O POS

## 2020-12-18 LAB — ETHANOL: Alcohol, Ethyl (B): <10 mg/dL (ref <10)

## 2020-12-18 LAB — MRSA PCR SCREENING: MRSA by PCR: NEGATIVE

## 2020-12-18 MED ORDER — LACTATED RINGERS IV BOLUS
1000.0000 mL | Freq: Once | INTRAVENOUS | Status: AC
  Administered 2020-12-18: 1000 mL via INTRAVENOUS

## 2020-12-18 MED ORDER — ONDANSETRON HCL 4 MG/2ML IJ SOLN: 4.0000 mg | Freq: Four times a day (QID) | INTRAMUSCULAR | Status: DC | PRN

## 2020-12-18 MED ORDER — SODIUM CHLORIDE 0.9 % IV SOLN: INTRAVENOUS | Status: DC

## 2020-12-18 MED ORDER — ONDANSETRON 4 MG PO TBDP
4.0000 mg | ORAL_TABLET | Freq: Four times a day (QID) | ORAL | Status: DC | PRN
  Administered 2020-12-23: 4 mg via ORAL
  Filled 2020-12-18: qty 1

## 2020-12-18 MED ORDER — IOHEXOL 300 MG/ML  SOLN
100.0000 mL | Freq: Once | INTRAMUSCULAR | Status: DC | PRN
  Administered 2020-12-18: 100 mL via INTRAVENOUS

## 2020-12-18 MED ORDER — PROCHLORPERAZINE MALEATE 10 MG PO TABS
10.0000 mg | ORAL_TABLET | Freq: Four times a day (QID) | ORAL | Status: DC | PRN
  Filled 2020-12-18: qty 1

## 2020-12-18 MED ORDER — PROCHLORPERAZINE EDISYLATE 10 MG/2ML IJ SOLN: 5.0000 mg | Freq: Four times a day (QID) | INTRAMUSCULAR | Status: DC | PRN

## 2020-12-18 MED ORDER — SODIUM CHLORIDE 0.9 % IV BOLUS: 500.0000 mL | Freq: Once | INTRAVENOUS | Status: DC

## 2020-12-18 MED ORDER — GABAPENTIN 300 MG PO CAPS
300.0000 mg | ORAL_CAPSULE | Freq: Three times a day (TID) | ORAL | Status: DC
  Administered 2020-12-18 – 2020-12-20 (×6): 300 mg via ORAL
  Filled 2020-12-18 (×6): qty 1

## 2020-12-18 MED ORDER — FENTANYL CITRATE (PF) 100 MCG/2ML IJ SOLN
50.0000 ug | Freq: Once | INTRAMUSCULAR | Status: AC
  Administered 2020-12-18: 50 ug via INTRAVENOUS
  Filled 2020-12-18: qty 2

## 2020-12-18 MED ORDER — ONDANSETRON HCL 4 MG/2ML IJ SOLN
4.0000 mg | Freq: Once | INTRAMUSCULAR | Status: AC
  Administered 2020-12-18: 4 mg via INTRAVENOUS
  Filled 2020-12-18: qty 2

## 2020-12-18 MED ORDER — SODIUM CHLORIDE 0.9% IV SOLUTION: Freq: Once | INTRAVENOUS | Status: DC

## 2020-12-18 MED ORDER — HYDROMORPHONE HCL 1 MG/ML IJ SOLN
0.5000 mg | INTRAMUSCULAR | Status: DC | PRN
  Administered 2020-12-18 – 2020-12-22 (×17): 0.5 mg via INTRAVENOUS
  Filled 2020-12-18 (×18): qty 1

## 2020-12-18 MED ORDER — OXYCODONE HCL 5 MG PO TABS
10.0000 mg | ORAL_TABLET | ORAL | Status: DC | PRN
  Administered 2020-12-20 – 2020-12-23 (×15): 10 mg via ORAL
  Filled 2020-12-18 (×15): qty 2

## 2020-12-18 MED ORDER — LACTATED RINGERS IV SOLN: INTRAVENOUS | Status: DC

## 2020-12-18 MED ORDER — OXYCODONE HCL 5 MG PO TABS
5.0000 mg | ORAL_TABLET | ORAL | Status: DC | PRN
  Administered 2020-12-18: 5 mg via ORAL
  Filled 2020-12-18 (×2): qty 1

## 2020-12-18 MED ORDER — GABAPENTIN 600 MG PO TABS
300.0000 mg | ORAL_TABLET | Freq: Three times a day (TID) | ORAL | Status: DC
  Filled 2020-12-18 (×2): qty 0.5

## 2020-12-18 MED ORDER — CHLORHEXIDINE GLUCONATE CLOTH 2 % EX PADS
6.0000 | MEDICATED_PAD | Freq: Every day | CUTANEOUS | Status: DC
  Administered 2020-12-19 – 2020-12-23 (×5): 6 via TOPICAL

## 2020-12-18 MED ORDER — ACETAMINOPHEN 325 MG PO TABS
650.0000 mg | ORAL_TABLET | Freq: Four times a day (QID) | ORAL | Status: DC
  Administered 2020-12-18 – 2020-12-21 (×12): 650 mg via ORAL
  Filled 2020-12-18 (×12): qty 2

## 2020-12-18 MED ORDER — FENTANYL CITRATE (PF) 100 MCG/2ML IJ SOLN
50.0000 ug | Freq: Once | INTRAMUSCULAR | Status: AC
Start: 2020-12-18 — End: 2020-12-18
  Administered 2020-12-18: 50 ug via INTRAVENOUS
  Filled 2020-12-18: qty 2

## 2020-12-18 NOTE — Progress Notes (Signed)
  Evaluation after Contrast Extravasation  Patient seen and examined immediately after contrast extravasation while in [facility] CT  Exam: There is mild swelling at the left antecubital area.  There is no erythema. There is no discoloration. There are no blisters. There are no signs of decreased perfusion of the skin.  It is warm to touch.  The patient has full ROM in fingers.  Radial pulse is normal.  Per contrast extravasation protocol, I have instructed the patient to keep an ice pack on the area for 20-60 minutes at a time for about 48 hours.   Keep arm elevated as much as possible.   The patient understands to call the radiology department if there is: - increase in pain or swelling - changed or altered sensation - ulceration or blistering - increasing redness - warmth or increasing firmness - decreased tissue perfusion as noted by decreased capillary refill or discoloration of skin - decreased pulses peripheral to site   Theresa Duty, NP 12/18/2020 10:46 AM
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
## 2020-12-18 NOTE — Progress Notes (Addendum)
Patient is having repeat SBP in the 70s and 80s. Patient asymptomatic and states that her BP typically runs lower but not this low. Dr. Thermon Leyland paged at this time.   2145 update: Per Dr. Quintella Reichert, continue to watch patient and check hgb when it is drawn at 2200. MD ok with SBP in 80s as long as she isn't symptomatic MD does not want to dilute patient's blood too much with fluid so we will wait until the next hgb comes back and/or patient becomes symptomatic. MD will order 2 units RBC and have blood bank hold them in case we need to give it rapidly. This RN will continue to watch patient closely. Other VSS and patient AOx4.

## 2020-12-18 NOTE — ED Notes (Signed)
Patient transported to CT 

## 2020-12-18 NOTE — TOC CAGE-AID Note (Signed)
Transition of Care [facility]) - CAGE-AID Screening   Patient Details  Name: Wendy [name] MRN: [id number] Date of Birth: 04/10/1956  Clinical Narrative:  Patient denies any need for substance abuse education.  CAGE-AID Screening:    Have You Ever Felt You Ought to Cut Down on Your Drinking or Drug Use?: No Have People Annoyed You By Critizing Your Drinking Or Drug Use?: No Have You Felt Bad Or Guilty About Your Drinking Or Drug Use?: No Have You Ever Had a Drink or Used Drugs First Thing In The Morning to Steady Your Nerves or to Get Rid of a Hangover?: No CAGE-AID Score: 0  Substance Abuse Education Offered: No

## 2020-12-19 ENCOUNTER — Inpatient Hospital Stay (HOSPITAL_COMMUNITY): Payer: 59

## 2020-12-19 LAB — BASIC METABOLIC PANEL
Anion gap: 9 (ref 5–15)
BUN: 8 mg/dL (ref 8–23)
CO2: 20 mmol/L — ABNORMAL LOW (ref 22–32)
Calcium: 8.2 mg/dL — ABNORMAL LOW (ref 8.9–10.3)
Chloride: 109 mmol/L (ref 98–111)
Creatinine, Ser: 0.62 mg/dL (ref 0.44–1.00)
GFR, Estimated: >60 mL/min (ref >60)
Glucose, Bld: 85 mg/dL (ref 70–99)
Potassium: 4.3 mmol/L (ref 3.5–5.1)
Sodium: 138 mmol/L (ref 135–145)

## 2020-12-19 LAB — CBC
HCT: 32.2 % — ABNORMAL LOW (ref 36.0–46.0)
HCT: 32 % — ABNORMAL LOW (ref 36.0–46.0)
HCT: 31.5 % — ABNORMAL LOW (ref 36.0–46.0)
Hemoglobin: 10.3 g/dL — ABNORMAL LOW (ref 12.0–15.0)
Hemoglobin: 10.5 g/dL — ABNORMAL LOW (ref 12.0–15.0)
Hemoglobin: 10.2 g/dL — ABNORMAL LOW (ref 12.0–15.0)
MCH: 30.7 pg (ref 26.0–34.0)
MCH: 31.1 pg (ref 26.0–34.0)
MCH: 31 pg (ref 26.0–34.0)
MCHC: 32 g/dL (ref 30.0–36.0)
MCHC: 32.8 g/dL (ref 30.0–36.0)
MCHC: 32.4 g/dL (ref 30.0–36.0)
MCV: 96.1 fL (ref 80.0–100.0)
MCV: 94.7 fL (ref 80.0–100.0)
MCV: 95.7 fL (ref 80.0–100.0)
Platelets: 143 10*3/uL — ABNORMAL LOW (ref 150–400)
Platelets: 131 10*3/uL — ABNORMAL LOW (ref 150–400)
Platelets: 133 10*3/uL — ABNORMAL LOW (ref 150–400)
RBC: 3.35 MIL/uL — ABNORMAL LOW (ref 3.87–5.11)
RBC: 3.38 MIL/uL — ABNORMAL LOW (ref 3.87–5.11)
RBC: 3.29 MIL/uL — ABNORMAL LOW (ref 3.87–5.11)
RDW: 12.8 % (ref 11.5–15.5)
RDW: 12.9 % (ref 11.5–15.5)
RDW: 12.8 % (ref 11.5–15.5)
WBC: 7.1 10*3/uL (ref 4.0–10.5)
WBC: 6.8 10*3/uL (ref 4.0–10.5)
WBC: 5.8 10*3/uL (ref 4.0–10.5)
nRBC: 0 % (ref 0.0–0.2)
nRBC: 0 % (ref 0.0–0.2)
nRBC: 0 % (ref 0.0–0.2)

## 2020-12-19 MED ORDER — ATORVASTATIN CALCIUM 10 MG PO TABS
10.0000 mg | ORAL_TABLET | Freq: Every day | ORAL | Status: DC
  Administered 2020-12-19 – 2020-12-23 (×5): 10 mg via ORAL
  Filled 2020-12-19 (×5): qty 1

## 2020-12-19 MED ORDER — MOMETASONE FURO-FORMOTEROL FUM 200-5 MCG/ACT IN AERO
2.0000 | INHALATION_SPRAY | Freq: Two times a day (BID) | RESPIRATORY_TRACT | Status: DC
  Administered 2020-12-20 – 2020-12-23 (×7): 2 via RESPIRATORY_TRACT
  Filled 2020-12-19: qty 8.8

## 2020-12-19 MED ORDER — METHOCARBAMOL 1000 MG/10ML IJ SOLN
500.0000 mg | Freq: Three times a day (TID) | INTRAVENOUS | Status: DC | PRN
  Administered 2020-12-19 – 2020-12-20 (×3): 500 mg via INTRAVENOUS
  Filled 2020-12-19 (×4): qty 5

## 2020-12-19 MED ORDER — VENLAFAXINE HCL ER 75 MG PO CP24
150.0000 mg | ORAL_CAPSULE | Freq: Every day | ORAL | Status: DC
  Administered 2020-12-19 – 2020-12-23 (×5): 150 mg via ORAL
  Filled 2020-12-19 (×4): qty 2
  Filled 2020-12-19: qty 1

## 2020-12-19 NOTE — Plan of Care (Signed)

## 2020-12-19 NOTE — Progress Notes (Addendum)
This RN called phlebotomy at this time regarding patient's CBC that was due 0033 and has not been drawn yet. Phlebotomist will draw lab asap.   0230: I called phlebotomy again regarding patient's 0033 CBC. Phlebotomist said that she drew the lab around 30 minutes ago. I confirmed that it was for patient in 4N26 because I didn't see the order being processed in results. I will continue to check for the result.
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
## 2020-12-20 LAB — BASIC METABOLIC PANEL
Anion gap: 6 (ref 5–15)
BUN: 6 mg/dL — ABNORMAL LOW (ref 8–23)
CO2: 26 mmol/L (ref 22–32)
Calcium: 8.4 mg/dL — ABNORMAL LOW (ref 8.9–10.3)
Chloride: 107 mmol/L (ref 98–111)
Creatinine, Ser: 0.56 mg/dL (ref 0.44–1.00)
GFR, Estimated: >60 mL/min (ref >60)
Glucose, Bld: 92 mg/dL (ref 70–99)
Potassium: 3.6 mmol/L (ref 3.5–5.1)
Sodium: 139 mmol/L (ref 135–145)

## 2020-12-20 LAB — CBC
HCT: 29.9 % — ABNORMAL LOW (ref 36.0–46.0)
HCT: 31.4 % — ABNORMAL LOW (ref 36.0–46.0)
Hemoglobin: 9.7 g/dL — ABNORMAL LOW (ref 12.0–15.0)
Hemoglobin: 9.9 g/dL — ABNORMAL LOW (ref 12.0–15.0)
MCH: 30.8 pg (ref 26.0–34.0)
MCH: 31.2 pg (ref 26.0–34.0)
MCHC: 31.5 g/dL (ref 30.0–36.0)
MCHC: 32.4 g/dL (ref 30.0–36.0)
MCV: 96.1 fL (ref 80.0–100.0)
MCV: 97.8 fL (ref 80.0–100.0)
Platelets: 120 10*3/uL — ABNORMAL LOW (ref 150–400)
Platelets: 131 10*3/uL — ABNORMAL LOW (ref 150–400)
RBC: 3.11 MIL/uL — ABNORMAL LOW (ref 3.87–5.11)
RBC: 3.21 MIL/uL — ABNORMAL LOW (ref 3.87–5.11)
RDW: 12.8 % (ref 11.5–15.5)
RDW: 12.9 % (ref 11.5–15.5)
WBC: 5.8 10*3/uL (ref 4.0–10.5)
WBC: 6 10*3/uL (ref 4.0–10.5)
nRBC: 0 % (ref 0.0–0.2)
nRBC: 0 % (ref 0.0–0.2)

## 2020-12-20 MED ORDER — DOCUSATE SODIUM 100 MG PO CAPS
100.0000 mg | ORAL_CAPSULE | Freq: Two times a day (BID) | ORAL | Status: DC
  Administered 2020-12-20 – 2020-12-23 (×7): 100 mg via ORAL
  Filled 2020-12-20 (×7): qty 1

## 2020-12-20 MED ORDER — POLYETHYLENE GLYCOL 3350 17 G PO PACK
17.0000 g | PACK | Freq: Every day | ORAL | Status: DC
  Administered 2020-12-20 – 2020-12-22 (×3): 17 g via ORAL
  Filled 2020-12-20 (×4): qty 1

## 2020-12-20 MED ORDER — METHOCARBAMOL 500 MG PO TABS
500.0000 mg | ORAL_TABLET | Freq: Four times a day (QID) | ORAL | Status: DC | PRN
  Administered 2020-12-20 – 2020-12-21 (×3): 500 mg via ORAL
  Filled 2020-12-20 (×3): qty 1
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
## 2020-12-20 NOTE — Plan of Care (Signed)

## 2020-12-20 NOTE — Progress Notes (Signed)
   12/20/20 1503  Clinical Encounter Type  Visited With Patient  Visit Type Follow-up;Spiritual support;Social support  Referral From Nurse  Consult/Referral To [place] responded to consult for prayer. Chaplain engaged active listening and provided emotional and spiritual support. Chaplain recited the Lord's Prayer with Pt. Chaplain will follow as needed.  This note was prepared by Chaplain Resident, Dante Gang, MDiv. Chaplain remains available as needed through the on-call pager: [phone].

## 2020-12-21 LAB — BASIC METABOLIC PANEL
Anion gap: 5 (ref 5–15)
BUN: 5 mg/dL — ABNORMAL LOW (ref 8–23)
CO2: 31 mmol/L (ref 22–32)
Calcium: 8.6 mg/dL — ABNORMAL LOW (ref 8.9–10.3)
Chloride: 102 mmol/L (ref 98–111)
Creatinine, Ser: 0.57 mg/dL (ref 0.44–1.00)
GFR, Estimated: >60 mL/min (ref >60)
Glucose, Bld: 152 mg/dL — ABNORMAL HIGH (ref 70–99)
Potassium: 3.5 mmol/L (ref 3.5–5.1)
Sodium: 138 mmol/L (ref 135–145)

## 2020-12-21 LAB — CBC
HCT: 31.6 % — ABNORMAL LOW (ref 36.0–46.0)
Hemoglobin: 10 g/dL — ABNORMAL LOW (ref 12.0–15.0)
MCH: 31.2 pg (ref 26.0–34.0)
MCHC: 31.6 g/dL (ref 30.0–36.0)
MCV: 98.4 fL (ref 80.0–100.0)
Platelets: 138 10*3/uL — ABNORMAL LOW (ref 150–400)
RBC: 3.21 MIL/uL — ABNORMAL LOW (ref 3.87–5.11)
RDW: 12.8 % (ref 11.5–15.5)
WBC: 7.2 10*3/uL (ref 4.0–10.5)
nRBC: 0 % (ref 0.0–0.2)

## 2020-12-21 MED ORDER — METHOCARBAMOL 500 MG PO TABS
1000.0000 mg | ORAL_TABLET | Freq: Three times a day (TID) | ORAL | Status: DC
  Administered 2020-12-21 – 2020-12-23 (×6): 1000 mg via ORAL
  Filled 2020-12-21 (×7): qty 2

## 2020-12-21 MED ORDER — HYDROMORPHONE HCL 1 MG/ML IJ SOLN
0.5000 mg | Freq: Once | INTRAMUSCULAR | Status: AC
Start: 2020-12-21 — End: 2020-12-21
  Administered 2020-12-21: 0.5 mg via INTRAVENOUS
  Filled 2020-12-21: qty 1

## 2020-12-21 MED ORDER — LORAZEPAM 2 MG/ML IJ SOLN
1.0000 mg | Freq: Once | INTRAMUSCULAR | Status: AC
  Administered 2020-12-21: 1 mg via INTRAVENOUS
  Filled 2020-12-21: qty 1

## 2020-12-21 MED ORDER — ACETAMINOPHEN 500 MG PO TABS
1000.0000 mg | ORAL_TABLET | Freq: Four times a day (QID) | ORAL | Status: DC
  Administered 2020-12-21 – 2020-12-23 (×8): 1000 mg via ORAL
  Filled 2020-12-21 (×8): qty 2

## 2020-12-21 MED ORDER — IBUPROFEN 200 MG PO TABS
600.0000 mg | ORAL_TABLET | Freq: Three times a day (TID) | ORAL | Status: DC
  Administered 2020-12-21 – 2020-12-23 (×7): 600 mg via ORAL
  Filled 2020-12-21 (×7): qty 3

## 2020-12-21 MED ORDER — LIDOCAINE 5 % EX PTCH
1.0000 | MEDICATED_PATCH | CUTANEOUS | Status: DC
  Administered 2020-12-21 – 2020-12-23 (×3): 1 via TRANSDERMAL
  Filled 2020-12-21 (×3): qty 1
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
## 2020-12-22 LAB — BASIC METABOLIC PANEL
Anion gap: 7 (ref 5–15)
BUN: 7 mg/dL — ABNORMAL LOW (ref 8–23)
CO2: 31 mmol/L (ref 22–32)
Calcium: 8.8 mg/dL — ABNORMAL LOW (ref 8.9–10.3)
Chloride: 101 mmol/L (ref 98–111)
Creatinine, Ser: 0.53 mg/dL (ref 0.44–1.00)
GFR, Estimated: >60 mL/min (ref >60)
Glucose, Bld: 120 mg/dL — ABNORMAL HIGH (ref 70–99)
Potassium: 4 mmol/L (ref 3.5–5.1)
Sodium: 139 mmol/L (ref 135–145)

## 2020-12-22 LAB — CBC
HCT: 30.7 % — ABNORMAL LOW (ref 36.0–46.0)
Hemoglobin: 10.1 g/dL — ABNORMAL LOW (ref 12.0–15.0)
MCH: 31.2 pg (ref 26.0–34.0)
MCHC: 32.9 g/dL (ref 30.0–36.0)
MCV: 94.8 fL (ref 80.0–100.0)
Platelets: 148 10*3/uL — ABNORMAL LOW (ref 150–400)
RBC: 3.24 MIL/uL — ABNORMAL LOW (ref 3.87–5.11)
RDW: 12.4 % (ref 11.5–15.5)
WBC: 6.2 10*3/uL (ref 4.0–10.5)
nRBC: 0 % (ref 0.0–0.2)

## 2020-12-22 MED ORDER — BOOST / RESOURCE BREEZE PO LIQD CUSTOM: 1.0000 | Freq: Two times a day (BID) | ORAL | Status: DC

## 2020-12-22 MED ORDER — ENOXAPARIN SODIUM 30 MG/0.3ML ~~LOC~~ SOLN
30.0000 mg | Freq: Two times a day (BID) | SUBCUTANEOUS | Status: DC
  Administered 2020-12-22 – 2020-12-23 (×3): 30 mg via SUBCUTANEOUS
  Filled 2020-12-22 (×3): qty 0.3

## 2020-12-22 MED ORDER — TRAMADOL HCL 50 MG PO TABS
50.0000 mg | ORAL_TABLET | Freq: Four times a day (QID) | ORAL | Status: DC
Start: 2020-12-22 — End: 2020-12-23
  Administered 2020-12-22 – 2020-12-23 (×4): 50 mg via ORAL
  Filled 2020-12-22 (×4): qty 1
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
## 2020-12-22 NOTE — Progress Notes (Signed)
OT Cancellation Note  Patient Details Name: Wendy [name] MRN: [id number] DOB: 07-15-1956   Cancelled Treatment:    Reason Eval/Treat Not Completed: Patient declined, no reason specified Pt reports getting up to the bathroom without (A) and now having pain. Pt with pain medication and declining until medication starts to work. Pt educated on pending d/c and need for assessment by OT. Pt states "its okay even if its tomorrow"   Billey Chang, OTR/L  Acute Rehabilitation Services Pager: [phone] Office: [phone] .  12/22/2020, 3:51 PM

## 2020-12-22 NOTE — Discharge Instructions (Signed)
Rib Fracture  A rib fracture is a break or crack in one of the bones of the ribs. The ribs are like a cage that goes around your upper chest. A broken or cracked rib is often painful, but most do not cause other problems. Most rib fractures usually heal on their own in 1-3 months. What are the causes?  Doing movements over and over again with a lot of force, such as pitching a baseball or having a very bad cough.  A direct hit to the chest.  Cancer that has spread to the bones. What are the signs or symptoms?  Pain when you breathe in or cough.  Pain when someone presses on the injured area.  Feeling short of breath. How is this treated? Treatment depends on how bad the fracture is. In general:  Most rib fractures usually heal on their own in 1-3 months.  Healing may take longer if you have a cough or are doing activities that make the injury worse.  While you heal, you may be given medicines to control pain.  You will also be taught deep breathing exercises.  Very bad injuries may require a stay at the hospital or surgery. Follow these instructions at home: Managing pain, stiffness, and swelling  If told, put ice on the injured area. To do this: ? Put ice in a plastic bag. ? Place a towel between your skin and the bag. ? Leave the ice on for 20 minutes, 2-3 times a day. ? Take off the ice if your skin turns bright red. This is very important. If you cannot feel pain, heat, or cold, you have a greater risk of damage to the area.  Take over-the-counter and prescription medicines only as told by your doctor. Activity  Avoid activities that cause pain to the injured area. Protect your injured area.  Slowly increase activity as told by your doctor. General instructions  Do deep breathing exercises as told by your doctor. You may be told to: ? Take deep breaths many times a day. ? Cough several times a day while hugging a pillow. ? Use a device (incentive spirometer) to do  deep breathing many times a day.  Drink enough fluid to keep your pee (urine) clear or pale yellow.  Do not wear a rib belt or binder.  Keep all follow-up visits. Contact a doctor if:  You have a fever. Get help right away if:  You have trouble breathing.  You are short of breath.  You cannot stop coughing.  You cough up thick or bloody spit.  You feel like you may vomit (nauseous), vomit, or have belly (abdominal) pain.  Your pain gets worse and medicine does not help. These symptoms may be an emergency. Get help right away. Call your local emergency services (911 in the [place]  Do not wait to see if the symptoms will go away.  Do not drive yourself to the hospital. Summary  A rib fracture is a break or crack in one of the bones of the ribs.  Apply ice to the injured area and take medicines for pain as told by your doctor.  Take deep breaths and cough several times a day. Hug a pillow every time you cough. This information is not intended to replace advice given to you by your health care provider. Make sure you discuss any questions you have with your health care provider. Document Revised: 12/26/2019 Document Reviewed: 12/26/2019 Elsevier Patient Education  2021 [employer].  Liver Laceration You had a splenic laceration, not liver, but this information is still pertinent as they don't have a splenic laceration information page  A liver laceration is a tear or a cut in the liver, an organ with many important functions in the body. Sometimes, a liver laceration can be a very serious injury as it can cause a lot of bleeding, and surgery may be needed. Other times, a liver laceration may be minor, and bed rest may be all that is needed. Either way, evaluation and treatment in a hospital is almost always required. Liver lacerations are categorized as Grades 1 to 5, with Grade 1 being the least severe and Grade 5 being the most serious. What are the causes? This  condition may be caused by:  A forceful hit to the area around the liver (blunt trauma), such as from a car crash. Blunt trauma can tear the liver even though it does not break the skin.  An injury in which an object goes through the skin and into the liver (penetrating injury), such as a stab or gunshot wound. What are the signs or symptoms? Common symptoms of this condition include:  A swollen and firm abdomen.  Pain in the abdomen.  Tenderness when pressing on the abdomen, especially on the right side of abdomen where the liver is located. Other symptoms include:  Bleeding from a penetrating wound.  Bruises on the abdomen.  A fast heartbeat.  Taking quick breaths.  Feeling weak and dizzy.  Looking pale due to blood loss. How is this diagnosed? This condition is diagnosed with a physical exam and medical history. Your health care provider may ask about any injuries to your chest or abdomen. You may also have tests, such as:  Blood tests. Your blood may be tested every few hours to see if you are losing blood.  Ultrasound. This may be done to look for blood in the abdomen.  CT scan. This may be done to look for blood in the abdomen.  Laparoscopy. This involves placing a small camera into the abdomen and looking directly at the surface of the liver. How is this treated? Treatment for this condition depends on the depth of the laceration, how much bleeding you have, and your overall condition. Treatment may include:  Monitoring and bed rest at a hospital.  Receiving donated blood through an IV (transfusion) to replace the blood that you have lost. You may need several transfusions.  Surgery to: ? Pack surgical gauze pads around the laceration to control the bleeding. ? Repair any injured blood vessels in your liver. ? Stop the bleeding from the arteries in the liver. Follow these instructions at home: Wound care  If you have a wound from a penetrating injury, follow  instructions from your health care provider about how to take care of your wound. Make sure you: ? Wash your hands with soap and water for at least 20 seconds before and after you change your bandage (dressing). If soap and water are not available, use hand sanitizer. ? Change your dressing as told by your health care provider. ? Leave stitches (sutures), skin glue, or adhesive strips in place. These skin closures may need to stay in place for 2 weeks or longer. If adhesive strip edges start to loosen and curl up, you may trim the loose edges. Do not remove adhesive strips completely unless your health care provider tells you to do that. ? If you have tubes placed in the wound to  drain out fluids (drains), make sure to take care of them as told by your health care provider.  Check your wound every day for signs of infection. Check for: ? More redness, warmth, swelling, tenderness, or pain. ? More fluid or blood that drains from the wound. ? Pus or a bad smell. ? Stitches or wounds that seem to be opening up (dehiscence).  Do not take baths, swim, or use a hot tub until your health care provider approves. Ask your health care provider if you may take showers. You may only be allowed to take sponge baths. Activity  Rest as told by your health care provider.  Ask your health care provider what activities are safe for you.  Do not lift anything that is heavier than 10 lb (4.5 kg), or the limit that you are told, until your health care provider says that it is safe.  Do not do activities that involve physical contact or require extra energy until your health care provider says that it is safe.   General instructions  Take over-the-counter and prescription medicines only as told by your health care provider. Do not take any other medicines unless you ask your health care provider about them first.  Ask your health care provider if the medicine prescribed to you requires you to avoid driving or  using machinery.  Do not use any products that contain nicotine or tobacco, such as cigarettes, e-cigarettes, and chewing tobacco. These can delay healing. If you need help quitting, ask your health care provider.  Keep all follow-up visits as told by your health care provider. This is important.   Contact a health care provider if:  Your abdominal pain does not go away.  You feel more weak and tired than usual.  You have a fever. Get help right away if:  Your abdominal pain gets worse.  You have trouble breathing.  You feel dizzy or very weak.  You feel confused or disoriented.  You have a wound on your skin that: ? Has more redness, swelling, or pain around it. ? Has more fluid or blood coming from it. ? Feels warm to the touch. ? Has pus or a bad smell coming from it. ? Has stitches or areas that seem to be opening up (dehiscence). Summary  A liver laceration is a tear or a cut in the liver.  A liver laceration can be a very serious injury that requires surgery to control bleeding.  Tests such as ultrasound or CT scans may be done to look at blood in the abdomen.  Treatment for this condition depends on how deep the laceration is, how much bleeding you have, and your overall condition. This information is not intended to replace advice given to you by your health care provider. Make sure you discuss any questions you have with your health care provider. Document Revised: 08/22/2019 Document Reviewed: 08/22/2019 Elsevier Patient Education  [employer].
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
## 2020-12-22 NOTE — Plan of Care (Signed)
  Problem: Elimination: Goal: Will not experience complications related to urinary retention Outcome: Completed/Met   Problem: Pain Managment: Goal: General experience of comfort will improve Outcome: Progressing   Problem: Safety: Goal: Ability to remain free from injury will improve Outcome: Progressing   Problem: Skin Integrity: Goal: Risk for impaired skin integrity will decrease Outcome: Progressing

## 2020-12-23 ENCOUNTER — Other Ambulatory Visit (HOSPITAL_COMMUNITY): Payer: Self-pay

## 2020-12-23 LAB — CBC
HCT: 32.6 % — ABNORMAL LOW (ref 36.0–46.0)
Hemoglobin: 10.6 g/dL — ABNORMAL LOW (ref 12.0–15.0)
MCH: 30.9 pg (ref 26.0–34.0)
MCHC: 32.5 g/dL (ref 30.0–36.0)
MCV: 95 fL (ref 80.0–100.0)
Platelets: 187 10*3/uL (ref 150–400)
RBC: 3.43 MIL/uL — ABNORMAL LOW (ref 3.87–5.11)
RDW: 12.4 % (ref 11.5–15.5)
WBC: 5.5 10*3/uL (ref 4.0–10.5)
nRBC: 0 % (ref 0.0–0.2)

## 2020-12-23 MED ORDER — IBUPROFEN 600 MG PO TABS: 600.0000 mg | ORAL_TABLET | Freq: Three times a day (TID) | ORAL | 0 refills | Status: DC | PRN

## 2020-12-23 MED ORDER — ONDANSETRON 4 MG PO TBDP
4.0000 mg | ORAL_TABLET | Freq: Four times a day (QID) | ORAL | 0 refills | Status: AC | PRN
  Filled 2020-12-23: qty 18, 5d supply, fill #0

## 2020-12-23 MED ORDER — METHOCARBAMOL 500 MG PO TABS
1000.0000 mg | ORAL_TABLET | Freq: Three times a day (TID) | ORAL | 1 refills | Status: DC | PRN
  Filled 2020-12-23: qty 60, 10d supply, fill #0

## 2020-12-23 MED ORDER — ACETAMINOPHEN 500 MG PO TABS: 1000.0000 mg | ORAL_TABLET | Freq: Four times a day (QID) | ORAL | 0 refills | Status: AC

## 2020-12-23 MED ORDER — LIDOCAINE 5 % EX PTCH
1.0000 | MEDICATED_PATCH | CUTANEOUS | 0 refills | Status: AC
  Filled 2020-12-23: qty 10, 10d supply, fill #0
  Filled 2020-12-23: qty 30, 30d supply, fill #0

## 2020-12-23 MED ORDER — OXYCODONE HCL 10 MG PO TABS
5.0000 mg | ORAL_TABLET | ORAL | 0 refills | Status: DC | PRN
  Filled 2020-12-23: qty 30, 5d supply, fill #0

## 2020-12-23 MED ORDER — TRAMADOL HCL 50 MG PO TABS
50.0000 mg | ORAL_TABLET | Freq: Four times a day (QID) | ORAL | 0 refills | Status: DC | PRN
  Filled 2020-12-23: qty 30, 8d supply, fill #0
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
## 2020-12-23 NOTE — TOC Transition Note (Signed)
Transition of Care [facility]) - CM/SW Discharge Note   Patient Details  Name: Wendy [name] MRN: [id number] Date of Birth: Jan 09, 1956  [facility] of [facility] CM/SW Contact:  Ella Bodo, RN Phone Number: 12/23/2020, 10:17 AM   Clinical Narrative:    Pt medically stable for dc home today with mom and significant other to assist.  PT/OT recommending no OP follow up, DME for home.  Referral to [facility] for RW and 3 in 1, to be delivered to bedside prior to dc. No other dc needs identified.    Final next level of care: Home/Self Care Barriers to Discharge: Continued Medical Work up   Patient Goals and CMS Choice Patient states their goals for this hospitalization and ongoing recovery are:: to go home                            Discharge Plan and Services   Discharge Planning Services: CM Consult            DME Arranged: 3-N-1,Walker rolling DME Agency: [employer] Date DME Agency Contacted: 12/23/20 Time DME Agency Contacted: 1000 Representative spoke with at DME Agency: [place] ([place]) Interventions     Readmission Risk Interventions Readmission Risk Prevention Plan 12/23/2020  Post Dischage Appt Complete  Medication Screening Complete  Transportation Screening Complete  Some recent data might be hidden   Reinaldo Raddle, RN, BSN  Trauma/Neuro ICU Case Manager [phone]
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
## 2020-12-23 NOTE — Progress Notes (Signed)
Discussed discharge instructions, including medications and medication schedule, with patient and significant other.  Both verbalize understanding, deny questions.  Patient discharged home via private vehicle, taken down in wheelchair.  No changes noted in assessment.  All belongings accounted for by patient, including medications that had been delivered, BSC, and walker.
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
## 2021-02-04 ENCOUNTER — Other Ambulatory Visit: Payer: Self-pay

## 2021-02-04 ENCOUNTER — Emergency Department (HOSPITAL_COMMUNITY): Payer: 59

## 2021-02-04 ENCOUNTER — Inpatient Hospital Stay (HOSPITAL_COMMUNITY)
Admission: EM | Admit: 2021-02-04 | Discharge: 2021-02-12 | DRG: 799 | Disposition: A | Discharge status: Home / Self Care | Payer: 59 | Attending: Surgery | Admitting: Surgery

## 2021-02-04 DIAGNOSIS — S3609XA Other injury of spleen, initial encounter: Principal | ICD-10-CM | POA: Diagnosis present

## 2021-02-04 DIAGNOSIS — Z23 Encounter for immunization: Secondary | ICD-10-CM

## 2021-02-04 DIAGNOSIS — K661 Hemoperitoneum: Secondary | ICD-10-CM | POA: Diagnosis present

## 2021-02-04 DIAGNOSIS — Z888 Allergy status to other drugs, medicaments and biological substances status: Secondary | ICD-10-CM

## 2021-02-04 DIAGNOSIS — Z79899 Other long term (current) drug therapy: Secondary | ICD-10-CM

## 2021-02-04 DIAGNOSIS — R55 Syncope and collapse: Secondary | ICD-10-CM | POA: Diagnosis present

## 2021-02-04 DIAGNOSIS — J45909 Unspecified asthma, uncomplicated: Secondary | ICD-10-CM | POA: Diagnosis present

## 2021-02-04 DIAGNOSIS — R578 Other shock: Secondary | ICD-10-CM | POA: Diagnosis present

## 2021-02-04 DIAGNOSIS — R140 Abdominal distension (gaseous): Secondary | ICD-10-CM | POA: Diagnosis not present

## 2021-02-04 DIAGNOSIS — S36039A Unspecified laceration of spleen, initial encounter: Secondary | ICD-10-CM

## 2021-02-04 DIAGNOSIS — E876 Hypokalemia: Secondary | ICD-10-CM | POA: Diagnosis present

## 2021-02-04 DIAGNOSIS — R112 Nausea with vomiting, unspecified: Secondary | ICD-10-CM

## 2021-02-04 DIAGNOSIS — N390 Urinary tract infection, site not specified: Secondary | ICD-10-CM | POA: Diagnosis present

## 2021-02-04 DIAGNOSIS — D62 Acute posthemorrhagic anemia: Secondary | ICD-10-CM | POA: Diagnosis present

## 2021-02-04 DIAGNOSIS — S2242XG Multiple fractures of ribs, left side, subsequent encounter for fracture with delayed healing: Secondary | ICD-10-CM

## 2021-02-04 DIAGNOSIS — K37 Unspecified appendicitis: Secondary | ICD-10-CM | POA: Diagnosis present

## 2021-02-04 DIAGNOSIS — Z20822 Contact with and (suspected) exposure to covid-19: Secondary | ICD-10-CM | POA: Diagnosis present

## 2021-02-04 DIAGNOSIS — Y92410 Unspecified street and highway as the place of occurrence of the external cause: Secondary | ICD-10-CM

## 2021-02-04 MED ORDER — SODIUM CHLORIDE 0.9 % IV BOLUS
1000.0000 mL | Freq: Once | INTRAVENOUS | Status: AC
  Administered 2021-02-05: 1000 mL via INTRAVENOUS
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
## 2021-02-05 ENCOUNTER — Emergency Department (HOSPITAL_COMMUNITY): Payer: 59

## 2021-02-05 ENCOUNTER — Inpatient Hospital Stay (HOSPITAL_COMMUNITY): Payer: 59

## 2021-02-05 DIAGNOSIS — E876 Hypokalemia: Secondary | ICD-10-CM | POA: Diagnosis present

## 2021-02-05 DIAGNOSIS — Z23 Encounter for immunization: Secondary | ICD-10-CM | POA: Diagnosis not present

## 2021-02-05 DIAGNOSIS — S3609XA Other injury of spleen, initial encounter: Secondary | ICD-10-CM | POA: Diagnosis present

## 2021-02-05 DIAGNOSIS — K37 Unspecified appendicitis: Secondary | ICD-10-CM | POA: Diagnosis present

## 2021-02-05 DIAGNOSIS — Y92410 Unspecified street and highway as the place of occurrence of the external cause: Secondary | ICD-10-CM | POA: Diagnosis not present

## 2021-02-05 DIAGNOSIS — R55 Syncope and collapse: Secondary | ICD-10-CM | POA: Diagnosis present

## 2021-02-05 DIAGNOSIS — R140 Abdominal distension (gaseous): Secondary | ICD-10-CM | POA: Diagnosis not present

## 2021-02-05 DIAGNOSIS — D62 Acute posthemorrhagic anemia: Secondary | ICD-10-CM | POA: Diagnosis present

## 2021-02-05 DIAGNOSIS — R578 Other shock: Secondary | ICD-10-CM | POA: Diagnosis present

## 2021-02-05 DIAGNOSIS — J45909 Unspecified asthma, uncomplicated: Secondary | ICD-10-CM | POA: Diagnosis present

## 2021-02-05 DIAGNOSIS — K661 Hemoperitoneum: Secondary | ICD-10-CM | POA: Diagnosis present

## 2021-02-05 DIAGNOSIS — N390 Urinary tract infection, site not specified: Secondary | ICD-10-CM | POA: Diagnosis present

## 2021-02-05 DIAGNOSIS — S2242XG Multiple fractures of ribs, left side, subsequent encounter for fracture with delayed healing: Secondary | ICD-10-CM | POA: Diagnosis not present

## 2021-02-05 DIAGNOSIS — Z79899 Other long term (current) drug therapy: Secondary | ICD-10-CM | POA: Diagnosis not present

## 2021-02-05 DIAGNOSIS — Z20822 Contact with and (suspected) exposure to covid-19: Secondary | ICD-10-CM | POA: Diagnosis present

## 2021-02-05 DIAGNOSIS — Z888 Allergy status to other drugs, medicaments and biological substances status: Secondary | ICD-10-CM | POA: Diagnosis not present

## 2021-02-05 HISTORY — PX: IR US GUIDE VASC ACCESS RIGHT: IMG2390

## 2021-02-05 HISTORY — PX: IR ANGIOGRAM SELECTIVE EACH ADDITIONAL VESSEL: IMG667

## 2021-02-05 HISTORY — PX: IR ANGIOGRAM VISCERAL SELECTIVE: IMG657

## 2021-02-05 HISTORY — PX: IR EMBO ART  VEN HEMORR LYMPH EXTRAV  INC GUIDE ROADMAPPING: IMG5450

## 2021-02-05 LAB — CBC WITH DIFFERENTIAL/PLATELET
Abs Immature Granulocytes: 0.63 10*3/uL — ABNORMAL HIGH (ref 0.00–0.07)
Basophils Absolute: 0.1 10*3/uL (ref 0.0–0.1)
Basophils Relative: 0 %
Eosinophils Absolute: 0.1 10*3/uL (ref 0.0–0.5)
Eosinophils Relative: 0 %
HCT: 23.3 % — ABNORMAL LOW (ref 36.0–46.0)
Hemoglobin: 7.1 g/dL — ABNORMAL LOW (ref 12.0–15.0)
Immature Granulocytes: 2 %
Lymphocytes Relative: 9 %
Lymphs Abs: 2.5 10*3/uL (ref 0.7–4.0)
MCH: 29.3 pg (ref 26.0–34.0)
MCHC: 30.5 g/dL (ref 30.0–36.0)
MCV: 96.3 fL (ref 80.0–100.0)
Monocytes Absolute: 0.8 10*3/uL (ref 0.1–1.0)
Monocytes Relative: 3 %
Neutro Abs: 23.2 10*3/uL — ABNORMAL HIGH (ref 1.7–7.7)
Neutrophils Relative %: 86 %
Platelets: 440 10*3/uL — ABNORMAL HIGH (ref 150–400)
RBC: 2.42 MIL/uL — ABNORMAL LOW (ref 3.87–5.11)
RDW: 13 % (ref 11.5–15.5)
WBC: 27.5 10*3/uL — ABNORMAL HIGH (ref 4.0–10.5)
nRBC: 0 % (ref 0.0–0.2)

## 2021-02-05 LAB — COMPREHENSIVE METABOLIC PANEL
ALT: 10 U/L (ref 0–44)
AST: 18 U/L (ref 15–41)
Albumin: 2.7 g/dL — ABNORMAL LOW (ref 3.5–5.0)
Alkaline Phosphatase: 81 U/L (ref 38–126)
Anion gap: 11 (ref 5–15)
BUN: 11 mg/dL (ref 8–23)
CO2: 22 mmol/L (ref 22–32)
Calcium: 8.3 mg/dL — ABNORMAL LOW (ref 8.9–10.3)
Chloride: 104 mmol/L (ref 98–111)
Creatinine, Ser: 0.92 mg/dL (ref 0.44–1.00)
GFR, Estimated: >60 mL/min (ref >60)
Glucose, Bld: 343 mg/dL — ABNORMAL HIGH (ref 70–99)
Potassium: 3.2 mmol/L — ABNORMAL LOW (ref 3.5–5.1)
Sodium: 137 mmol/L (ref 135–145)
Total Bilirubin: 0.4 mg/dL (ref 0.3–1.2)
Total Protein: 5 g/dL — ABNORMAL LOW (ref 6.5–8.1)

## 2021-02-05 LAB — CBC
HCT: 25.5 % — ABNORMAL LOW (ref 36.0–46.0)
HCT: 25.4 % — ABNORMAL LOW (ref 36.0–46.0)
HCT: 25.6 % — ABNORMAL LOW (ref 36.0–46.0)
Hemoglobin: 8.3 g/dL — ABNORMAL LOW (ref 12.0–15.0)
Hemoglobin: 8.3 g/dL — ABNORMAL LOW (ref 12.0–15.0)
Hemoglobin: 8.1 g/dL — ABNORMAL LOW (ref 12.0–15.0)
MCH: 29.1 pg (ref 26.0–34.0)
MCH: 29.2 pg (ref 26.0–34.0)
MCH: 28.4 pg (ref 26.0–34.0)
MCHC: 32.5 g/dL (ref 30.0–36.0)
MCHC: 32.7 g/dL (ref 30.0–36.0)
MCHC: 31.6 g/dL (ref 30.0–36.0)
MCV: 89.5 fL (ref 80.0–100.0)
MCV: 89.4 fL (ref 80.0–100.0)
MCV: 89.8 fL (ref 80.0–100.0)
Platelets: 268 10*3/uL (ref 150–400)
Platelets: 296 10*3/uL (ref 150–400)
Platelets: 268 10*3/uL (ref 150–400)
RBC: 2.85 MIL/uL — ABNORMAL LOW (ref 3.87–5.11)
RBC: 2.84 MIL/uL — ABNORMAL LOW (ref 3.87–5.11)
RBC: 2.85 MIL/uL — ABNORMAL LOW (ref 3.87–5.11)
RDW: 14.8 % (ref 11.5–15.5)
RDW: 15.3 % (ref 11.5–15.5)
RDW: 15.3 % (ref 11.5–15.5)
WBC: 15 10*3/uL — ABNORMAL HIGH (ref 4.0–10.5)
WBC: 15.3 10*3/uL — ABNORMAL HIGH (ref 4.0–10.5)
WBC: 16.4 10*3/uL — ABNORMAL HIGH (ref 4.0–10.5)
nRBC: 0 % (ref 0.0–0.2)
nRBC: 0 % (ref 0.0–0.2)
nRBC: 0 % (ref 0.0–0.2)

## 2021-02-05 LAB — PREPARE RBC (CROSSMATCH)

## 2021-02-05 LAB — HEMOGLOBIN AND HEMATOCRIT, BLOOD
HCT: 25.2 % — ABNORMAL LOW (ref 36.0–46.0)
Hemoglobin: 8.1 g/dL — ABNORMAL LOW (ref 12.0–15.0)

## 2021-02-05 LAB — BASIC METABOLIC PANEL
Anion gap: 7 (ref 5–15)
BUN: 9 mg/dL (ref 8–23)
CO2: 24 mmol/L (ref 22–32)
Calcium: 7.6 mg/dL — ABNORMAL LOW (ref 8.9–10.3)
Chloride: 111 mmol/L (ref 98–111)
Creatinine, Ser: 0.58 mg/dL (ref 0.44–1.00)
GFR, Estimated: >60 mL/min (ref >60)
Glucose, Bld: 97 mg/dL (ref 70–99)
Potassium: 3.4 mmol/L — ABNORMAL LOW (ref 3.5–5.1)
Sodium: 142 mmol/L (ref 135–145)

## 2021-02-05 LAB — URINALYSIS, COMPLETE (UACMP) WITH MICROSCOPIC
Bacteria, UA: NONE SEEN
Bilirubin Urine: NEGATIVE
Glucose, UA: NEGATIVE mg/dL
Hgb urine dipstick: NEGATIVE
Ketones, ur: NEGATIVE mg/dL
Nitrite: NEGATIVE
Protein, ur: NEGATIVE mg/dL
Specific Gravity, Urine: 1.043 — ABNORMAL HIGH (ref 1.005–1.030)
pH: 5 (ref 5.0–8.0)

## 2021-02-05 LAB — PROTIME-INR
INR: 1.2 (ref 0.8–1.2)
Prothrombin Time: 15.6 seconds — ABNORMAL HIGH (ref 11.4–15.2)

## 2021-02-05 LAB — RESP PANEL BY RT-PCR (FLU A&B, COVID) ARPGX2
Influenza A by PCR: NEGATIVE
Influenza B by PCR: NEGATIVE
SARS Coronavirus 2 by RT PCR: NEGATIVE

## 2021-02-05 LAB — MRSA PCR SCREENING: MRSA by PCR: NEGATIVE

## 2021-02-05 LAB — TROPONIN I (HIGH SENSITIVITY)
Troponin I (High Sensitivity): 18 ng/L — ABNORMAL HIGH (ref <18)
Troponin I (High Sensitivity): 24 ng/L — ABNORMAL HIGH (ref <18)

## 2021-02-05 MED ORDER — IOHEXOL 300 MG/ML  SOLN
75.0000 mL | Freq: Once | INTRAMUSCULAR | Status: AC | PRN
  Administered 2021-02-05: 75 mL via INTRAVENOUS

## 2021-02-05 MED ORDER — LIDOCAINE HCL (PF) 1 % IJ SOLN
INTRAMUSCULAR | Status: AC
  Filled 2021-02-05: qty 30

## 2021-02-05 MED ORDER — MUPIROCIN 2 % EX OINT: 1.0000 "application " | TOPICAL_OINTMENT | Freq: Two times a day (BID) | CUTANEOUS | Status: DC

## 2021-02-05 MED ORDER — MIDAZOLAM HCL 2 MG/2ML IJ SOLN
INTRAMUSCULAR | Status: AC | PRN
  Administered 2021-02-05: 1 mg via INTRAVENOUS

## 2021-02-05 MED ORDER — IOHEXOL 300 MG/ML  SOLN
50.0000 mL | Freq: Once | INTRAMUSCULAR | Status: AC | PRN
  Administered 2021-02-05: 40 mL via INTRA_ARTERIAL

## 2021-02-05 MED ORDER — LIDOCAINE 5 % EX PTCH
1.0000 | MEDICATED_PATCH | CUTANEOUS | Status: DC
  Administered 2021-02-05 – 2021-02-08 (×4): 1 via TRANSDERMAL
  Filled 2021-02-05 (×7): qty 1

## 2021-02-05 MED ORDER — ALBUTEROL SULFATE HFA 108 (90 BASE) MCG/ACT IN AERS
2.0000 | INHALATION_SPRAY | RESPIRATORY_TRACT | Status: DC | PRN
  Administered 2021-02-05: 2 via RESPIRATORY_TRACT
  Filled 2021-02-05: qty 6.7

## 2021-02-05 MED ORDER — ACETAMINOPHEN 500 MG PO TABS
1000.0000 mg | ORAL_TABLET | Freq: Four times a day (QID) | ORAL | Status: DC
  Administered 2021-02-07 – 2021-02-12 (×15): 1000 mg via ORAL
  Filled 2021-02-05 (×20): qty 2

## 2021-02-05 MED ORDER — SODIUM CHLORIDE 0.9 % IV SOLN: INTRAVENOUS | Status: DC

## 2021-02-05 MED ORDER — HYDROMORPHONE HCL 1 MG/ML IJ SOLN
0.5000 mg | INTRAMUSCULAR | Status: DC | PRN
  Administered 2021-02-05 – 2021-02-07 (×10): 1 mg via INTRAVENOUS
  Administered 2021-02-07: 0.5 mg via INTRAVENOUS
  Administered 2021-02-08 – 2021-02-09 (×9): 1 mg via INTRAVENOUS
  Filled 2021-02-05 (×20): qty 1

## 2021-02-05 MED ORDER — ALPRAZOLAM 0.25 MG PO TABS
0.2500 mg | ORAL_TABLET | Freq: Every day | ORAL | Status: DC | PRN
  Administered 2021-02-08 – 2021-02-12 (×3): 0.25 mg via ORAL
  Filled 2021-02-05 (×4): qty 1

## 2021-02-05 MED ORDER — VENLAFAXINE HCL ER 75 MG PO CP24
150.0000 mg | ORAL_CAPSULE | Freq: Every morning | ORAL | Status: DC
  Administered 2021-02-05 – 2021-02-12 (×8): 150 mg via ORAL
  Filled 2021-02-05: qty 1
  Filled 2021-02-05: qty 2
  Filled 2021-02-05 (×3): qty 1
  Filled 2021-02-05 (×2): qty 2
  Filled 2021-02-05: qty 1

## 2021-02-05 MED ORDER — MOMETASONE FURO-FORMOTEROL FUM 200-5 MCG/ACT IN AERO
2.0000 | INHALATION_SPRAY | Freq: Two times a day (BID) | RESPIRATORY_TRACT | Status: DC
  Administered 2021-02-05 – 2021-02-12 (×15): 2 via RESPIRATORY_TRACT
  Filled 2021-02-05 (×2): qty 8.8

## 2021-02-05 MED ORDER — SODIUM CHLORIDE 0.9 % IV SOLN
12.5000 mg | Freq: Four times a day (QID) | INTRAVENOUS | Status: DC | PRN
  Administered 2021-02-05 – 2021-02-06 (×3): 12.5 mg via INTRAVENOUS
  Filled 2021-02-05 (×3): qty 0.5

## 2021-02-05 MED ORDER — POTASSIUM CHLORIDE 10 MEQ/100ML IV SOLN
10.0000 meq | INTRAVENOUS | Status: AC
  Administered 2021-02-05 (×4): 10 meq via INTRAVENOUS
  Filled 2021-02-05 (×4): qty 100

## 2021-02-05 MED ORDER — MIDAZOLAM HCL 2 MG/2ML IJ SOLN
INTRAMUSCULAR | Status: AC
  Filled 2021-02-05: qty 2

## 2021-02-05 MED ORDER — CIPROFLOXACIN IN D5W 400 MG/200ML IV SOLN
400.0000 mg | Freq: Two times a day (BID) | INTRAVENOUS | Status: DC
  Administered 2021-02-05 – 2021-02-06 (×3): 400 mg via INTRAVENOUS
  Filled 2021-02-05 (×3): qty 200

## 2021-02-05 MED ORDER — FENTANYL CITRATE (PF) 100 MCG/2ML IJ SOLN
INTRAMUSCULAR | Status: AC | PRN
  Administered 2021-02-05 (×2): 25 ug via INTRAVENOUS

## 2021-02-05 MED ORDER — ONDANSETRON HCL 4 MG/2ML IJ SOLN
4.0000 mg | Freq: Four times a day (QID) | INTRAMUSCULAR | Status: DC | PRN
  Administered 2021-02-05 – 2021-02-08 (×3): 4 mg via INTRAVENOUS
  Filled 2021-02-05 (×3): qty 2

## 2021-02-05 MED ORDER — SODIUM CHLORIDE 0.9 % IV SOLN
10.0000 mL/h | Freq: Once | INTRAVENOUS | Status: AC
  Administered 2021-02-05: 10 mL/h via INTRAVENOUS

## 2021-02-05 MED ORDER — ONDANSETRON 4 MG PO TBDP: 4.0000 mg | ORAL_TABLET | Freq: Four times a day (QID) | ORAL | Status: DC | PRN

## 2021-02-05 MED ORDER — METHOCARBAMOL 500 MG PO TABS
1000.0000 mg | ORAL_TABLET | Freq: Three times a day (TID) | ORAL | Status: DC | PRN
  Administered 2021-02-08: 1000 mg via ORAL
  Filled 2021-02-05 (×2): qty 2

## 2021-02-05 MED ORDER — CHLORHEXIDINE GLUCONATE CLOTH 2 % EX PADS
6.0000 | MEDICATED_PAD | Freq: Every day | CUTANEOUS | Status: DC
  Administered 2021-02-06 – 2021-02-11 (×5): 6 via TOPICAL

## 2021-02-05 MED ORDER — IOHEXOL 300 MG/ML  SOLN
50.0000 mL | Freq: Once | INTRAMUSCULAR | Status: AC | PRN
  Administered 2021-02-05: 10 mL via INTRA_ARTERIAL

## 2021-02-05 MED ORDER — DOCUSATE SODIUM 100 MG PO CAPS
100.0000 mg | ORAL_CAPSULE | Freq: Two times a day (BID) | ORAL | Status: DC
  Administered 2021-02-06 – 2021-02-12 (×3): 100 mg via ORAL
  Filled 2021-02-05 (×7): qty 1

## 2021-02-05 MED ORDER — FENTANYL CITRATE (PF) 100 MCG/2ML IJ SOLN
INTRAMUSCULAR | Status: AC
  Filled 2021-02-05: qty 2

## 2021-02-05 MED ORDER — OXYCODONE HCL 5 MG PO TABS
5.0000 mg | ORAL_TABLET | ORAL | Status: DC | PRN
  Administered 2021-02-07 – 2021-02-09 (×5): 10 mg via ORAL
  Filled 2021-02-05 (×6): qty 2

## 2021-02-05 NOTE — Sedation Documentation (Signed)
Right femoral sheath removed. 69fr Exoseal closure device deployed to right groin.

## 2021-02-05 NOTE — ED Notes (Signed)
1 episode of small coffee ground emesis, EDP made aware
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
## 2021-02-05 NOTE — TOC CAGE-AID Note (Signed)
Transition of Care [facility]) - CAGE-AID Screening   Patient Details  Name: Wendy [name] MRN: [id number] Date of Birth: 12/15/1955  Clinical Narrative:  Patient denies any alcohol or drug use.  CAGE-AID Screening:    Have You Ever Felt You Ought to Cut Down on Your Drinking or Drug Use?: No Have People Annoyed You By Critizing Your Drinking Or Drug Use?: No Have You Felt Bad Or Guilty About Your Drinking Or Drug Use?: No Have You Ever Had a Drink or Used Drugs First Thing In The Morning to Steady Your Nerves or to Get Rid of a Hangover?: No CAGE-AID Score: 0  Substance Abuse Education Offered: No

## 2021-02-05 NOTE — Procedures (Signed)
Interventional Radiology Procedure Note  Procedure: trauma proximal splenic embo    Complications: None  Estimated Blood Loss:  min  Findings: Full report in pacs     Tamera Punt, MD

## 2021-02-05 NOTE — Progress Notes (Signed)
Pt vomited dark brown/black liquid X 2. Dr. Zenia Resides notified. Orders given for KUB and phenergan.
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
## 2021-02-06 LAB — CBC
HCT: 23.6 % — ABNORMAL LOW (ref 36.0–46.0)
HCT: 25.4 % — ABNORMAL LOW (ref 36.0–46.0)
HCT: 24.9 % — ABNORMAL LOW (ref 36.0–46.0)
HCT: 22 % — ABNORMAL LOW (ref 36.0–46.0)
Hemoglobin: 7.6 g/dL — ABNORMAL LOW (ref 12.0–15.0)
Hemoglobin: 8.1 g/dL — ABNORMAL LOW (ref 12.0–15.0)
Hemoglobin: 8 g/dL — ABNORMAL LOW (ref 12.0–15.0)
Hemoglobin: 6.9 g/dL — CL (ref 12.0–15.0)
MCH: 29.2 pg (ref 26.0–34.0)
MCH: 29 pg (ref 26.0–34.0)
MCH: 29.2 pg (ref 26.0–34.0)
MCH: 28.5 pg (ref 26.0–34.0)
MCHC: 32.2 g/dL (ref 30.0–36.0)
MCHC: 31.9 g/dL (ref 30.0–36.0)
MCHC: 32.1 g/dL (ref 30.0–36.0)
MCHC: 31.4 g/dL (ref 30.0–36.0)
MCV: 90.8 fL (ref 80.0–100.0)
MCV: 91 fL (ref 80.0–100.0)
MCV: 90.9 fL (ref 80.0–100.0)
MCV: 90.9 fL (ref 80.0–100.0)
Platelets: 287 10*3/uL (ref 150–400)
Platelets: 295 10*3/uL (ref 150–400)
Platelets: 300 10*3/uL (ref 150–400)
Platelets: 265 10*3/uL (ref 150–400)
RBC: 2.6 MIL/uL — ABNORMAL LOW (ref 3.87–5.11)
RBC: 2.79 MIL/uL — ABNORMAL LOW (ref 3.87–5.11)
RBC: 2.74 MIL/uL — ABNORMAL LOW (ref 3.87–5.11)
RBC: 2.42 MIL/uL — ABNORMAL LOW (ref 3.87–5.11)
RDW: 15.5 % (ref 11.5–15.5)
RDW: 15.5 % (ref 11.5–15.5)
RDW: 15.5 % (ref 11.5–15.5)
RDW: 15.2 % (ref 11.5–15.5)
WBC: 14.5 10*3/uL — ABNORMAL HIGH (ref 4.0–10.5)
WBC: 15.1 10*3/uL — ABNORMAL HIGH (ref 4.0–10.5)
WBC: 14.5 10*3/uL — ABNORMAL HIGH (ref 4.0–10.5)
WBC: 11.8 10*3/uL — ABNORMAL HIGH (ref 4.0–10.5)
nRBC: 0 % (ref 0.0–0.2)
nRBC: 0 % (ref 0.0–0.2)
nRBC: 0 % (ref 0.0–0.2)
nRBC: 0 % (ref 0.0–0.2)

## 2021-02-06 LAB — BASIC METABOLIC PANEL
Anion gap: 6 (ref 5–15)
BUN: 7 mg/dL — ABNORMAL LOW (ref 8–23)
CO2: 24 mmol/L (ref 22–32)
Calcium: 8.1 mg/dL — ABNORMAL LOW (ref 8.9–10.3)
Chloride: 110 mmol/L (ref 98–111)
Creatinine, Ser: 0.53 mg/dL (ref 0.44–1.00)
GFR, Estimated: >60 mL/min (ref >60)
Glucose, Bld: 105 mg/dL — ABNORMAL HIGH (ref 70–99)
Potassium: 3.1 mmol/L — ABNORMAL LOW (ref 3.5–5.1)
Sodium: 140 mmol/L (ref 135–145)

## 2021-02-06 LAB — URINE CULTURE: Culture: NO GROWTH

## 2021-02-06 LAB — PREPARE RBC (CROSSMATCH)

## 2021-02-06 MED ORDER — SODIUM CHLORIDE 0.9% IV SOLUTION
Freq: Once | INTRAVENOUS | Status: AC
Start: 2021-02-07 — End: 2021-02-07

## 2021-02-06 MED ORDER — SIMETHICONE 80 MG PO CHEW
160.0000 mg | CHEWABLE_TABLET | Freq: Three times a day (TID) | ORAL | Status: DC | PRN
  Administered 2021-02-06 – 2021-02-07 (×2): 160 mg via ORAL
  Filled 2021-02-06 (×2): qty 2

## 2021-02-06 MED ORDER — POTASSIUM CHLORIDE 10 MEQ/100ML IV SOLN
10.0000 meq | INTRAVENOUS | Status: AC
  Administered 2021-02-06 (×4): 10 meq via INTRAVENOUS
  Filled 2021-02-06 (×4): qty 100

## 2021-02-06 MED ORDER — PHENYLEPHRINE IN HARD FAT 0.25 % RE SUPP
1.0000 | Freq: Two times a day (BID) | RECTAL | Status: DC | PRN
  Administered 2021-02-06 – 2021-02-10 (×3): 1 via RECTAL
  Filled 2021-02-06 (×6): qty 1

## 2021-02-06 MED ORDER — POTASSIUM CHLORIDE CRYS ER 20 MEQ PO TBCR: 40.0000 meq | EXTENDED_RELEASE_TABLET | Freq: Two times a day (BID) | ORAL | Status: DC
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
## 2021-02-06 NOTE — Progress Notes (Signed)
PT Cancellation Note  Patient Details Name: Wendy [name] MRN: [id number] DOB: October 22, 1955   Cancelled Treatment:    Reason Eval/Treat Not Completed: Active bedrest order. Will follow-up as time permits and as appropriate.   Moishe Spice, PT, DPT Acute Rehabilitation Services  Pager: [phone] Office: [place] 02/06/2021, 9:23 AM

## 2021-02-06 NOTE — Progress Notes (Signed)
Date and time results received: 02/06/21 2320  Test: Hgb Critical Value: 6.9  Name of Provider Notified: Dr Barry Dienes  Orders Received? Or Actions Taken?: Orders received to transfuse 1U PRBC
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
## 2021-02-07 LAB — URINALYSIS, ROUTINE W REFLEX MICROSCOPIC
Bilirubin Urine: NEGATIVE
Glucose, UA: NEGATIVE mg/dL
Hgb urine dipstick: NEGATIVE
Ketones, ur: 20 mg/dL — AB
Leukocytes,Ua: NEGATIVE
Nitrite: NEGATIVE
Protein, ur: NEGATIVE mg/dL
Specific Gravity, Urine: 1.01 (ref 1.005–1.030)
pH: 6 (ref 5.0–8.0)

## 2021-02-07 LAB — CBC
HCT: 25.3 % — ABNORMAL LOW (ref 36.0–46.0)
Hemoglobin: 8.3 g/dL — ABNORMAL LOW (ref 12.0–15.0)
MCH: 29.1 pg (ref 26.0–34.0)
MCHC: 32.8 g/dL (ref 30.0–36.0)
MCV: 88.8 fL (ref 80.0–100.0)
Platelets: 287 10*3/uL (ref 150–400)
RBC: 2.85 MIL/uL — ABNORMAL LOW (ref 3.87–5.11)
RDW: 14.8 % (ref 11.5–15.5)
WBC: 12 10*3/uL — ABNORMAL HIGH (ref 4.0–10.5)
nRBC: 0.2 % (ref 0.0–0.2)

## 2021-02-07 LAB — HEMOGLOBIN AND HEMATOCRIT, BLOOD
HCT: 26.9 % — ABNORMAL LOW (ref 36.0–46.0)
Hemoglobin: 8.5 g/dL — ABNORMAL LOW (ref 12.0–15.0)

## 2021-02-07 MED ORDER — CEFAZOLIN SODIUM-DEXTROSE 2-4 GM/100ML-% IV SOLN
2.0000 g | INTRAVENOUS | Status: AC
  Administered 2021-02-08: 2 g via INTRAVENOUS
  Filled 2021-02-07 (×2): qty 100
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
## 2021-02-08 ENCOUNTER — Encounter (HOSPITAL_COMMUNITY): Admission: EM | Disposition: A | Discharge status: Home / Self Care | Payer: Self-pay | Source: Home / Self Care

## 2021-02-08 ENCOUNTER — Inpatient Hospital Stay (HOSPITAL_COMMUNITY): Payer: 59 | Admitting: Anesthesiology

## 2021-02-08 ENCOUNTER — Encounter (HOSPITAL_COMMUNITY): Payer: Self-pay

## 2021-02-08 HISTORY — PX: LAPAROTOMY: SHX154

## 2021-02-08 HISTORY — PX: SPLENECTOMY, TOTAL: SHX788

## 2021-02-08 HISTORY — PX: APPENDECTOMY: SHX54

## 2021-02-08 LAB — CBC
HCT: 25.4 % — ABNORMAL LOW (ref 36.0–46.0)
Hemoglobin: 8.1 g/dL — ABNORMAL LOW (ref 12.0–15.0)
MCH: 28.8 pg (ref 26.0–34.0)
MCHC: 31.9 g/dL (ref 30.0–36.0)
MCV: 90.4 fL (ref 80.0–100.0)
Platelets: 348 10*3/uL (ref 150–400)
RBC: 2.81 MIL/uL — ABNORMAL LOW (ref 3.87–5.11)
RDW: 14.8 % (ref 11.5–15.5)
WBC: 9.6 10*3/uL (ref 4.0–10.5)
nRBC: 0.2 % (ref 0.0–0.2)

## 2021-02-08 LAB — POCT I-STAT, CHEM 8
BUN: <3 mg/dL — ABNORMAL LOW (ref 8–23)
Calcium, Ion: 1.1 mmol/L — ABNORMAL LOW (ref 1.15–1.40)
Chloride: 102 mmol/L (ref 98–111)
Creatinine, Ser: 0.2 mg/dL — ABNORMAL LOW (ref 0.44–1.00)
Glucose, Bld: 124 mg/dL — ABNORMAL HIGH (ref 70–99)
HCT: 25 % — ABNORMAL LOW (ref 36.0–46.0)
Hemoglobin: 8.5 g/dL — ABNORMAL LOW (ref 12.0–15.0)
Potassium: 2.9 mmol/L — ABNORMAL LOW (ref 3.5–5.1)
Sodium: 139 mmol/L (ref 135–145)
TCO2: 24 mmol/L (ref 22–32)

## 2021-02-08 LAB — SURGICAL PCR SCREEN
MRSA, PCR: NEGATIVE
Staphylococcus aureus: NEGATIVE

## 2021-02-08 LAB — PREPARE RBC (CROSSMATCH)

## 2021-02-08 SURGERY — LAPAROTOMY, EXPLORATORY
Anesthesia: General | Site: Abdomen

## 2021-02-08 MED ORDER — LIDOCAINE 2% (20 MG/ML) 5 ML SYRINGE
INTRAMUSCULAR | Status: DC | PRN
  Administered 2021-02-08: 60 mg via INTRAVENOUS

## 2021-02-08 MED ORDER — OXYCODONE HCL 5 MG PO TABS: 5.0000 mg | ORAL_TABLET | Freq: Once | ORAL | Status: DC | PRN

## 2021-02-08 MED ORDER — PROPOFOL 10 MG/ML IV BOLUS
INTRAVENOUS | Status: AC
  Filled 2021-02-08: qty 20

## 2021-02-08 MED ORDER — FENTANYL CITRATE (PF) 100 MCG/2ML IJ SOLN
INTRAMUSCULAR | Status: DC | PRN
  Administered 2021-02-08 (×4): 50 ug via INTRAVENOUS

## 2021-02-08 MED ORDER — DEXAMETHASONE SODIUM PHOSPHATE 10 MG/ML IJ SOLN
INTRAMUSCULAR | Status: DC | PRN
  Administered 2021-02-08: 5 mg via INTRAVENOUS

## 2021-02-08 MED ORDER — SODIUM CHLORIDE 0.9% IV SOLUTION: Freq: Once | INTRAVENOUS | Status: DC

## 2021-02-08 MED ORDER — PROPOFOL 10 MG/ML IV BOLUS
INTRAVENOUS | Status: DC | PRN
  Administered 2021-02-08: 140 mg via INTRAVENOUS

## 2021-02-08 MED ORDER — 0.9 % SODIUM CHLORIDE (POUR BTL) OPTIME
TOPICAL | Status: DC | PRN
  Administered 2021-02-08: 1000 mL
  Administered 2021-02-08: 2000 mL
  Administered 2021-02-08: 3000 mL

## 2021-02-08 MED ORDER — DEXAMETHASONE SODIUM PHOSPHATE 10 MG/ML IJ SOLN
INTRAMUSCULAR | Status: AC
  Filled 2021-02-08: qty 1

## 2021-02-08 MED ORDER — LIDOCAINE 2% (20 MG/ML) 5 ML SYRINGE
INTRAMUSCULAR | Status: AC
  Filled 2021-02-08: qty 5

## 2021-02-08 MED ORDER — CHLORHEXIDINE GLUCONATE 0.12 % MT SOLN
OROMUCOSAL | Status: AC
  Administered 2021-02-08: 15 mL
  Filled 2021-02-08: qty 15

## 2021-02-08 MED ORDER — ROCURONIUM BROMIDE 10 MG/ML (PF) SYRINGE
PREFILLED_SYRINGE | INTRAVENOUS | Status: AC
  Filled 2021-02-08: qty 10

## 2021-02-08 MED ORDER — ONDANSETRON HCL 4 MG/2ML IJ SOLN
INTRAMUSCULAR | Status: DC | PRN
  Administered 2021-02-08: 4 mg via INTRAVENOUS

## 2021-02-08 MED ORDER — OXYCODONE HCL 5 MG/5ML PO SOLN: 5.0000 mg | Freq: Once | ORAL | Status: DC | PRN

## 2021-02-08 MED ORDER — FENTANYL CITRATE (PF) 100 MCG/2ML IJ SOLN
25.0000 ug | INTRAMUSCULAR | Status: DC | PRN
  Administered 2021-02-08: 25 ug via INTRAVENOUS

## 2021-02-08 MED ORDER — POLYVINYL ALCOHOL 1.4 % OP SOLN: 1.0000 [drp] | Freq: Every day | OPHTHALMIC | Status: DC | PRN

## 2021-02-08 MED ORDER — ONDANSETRON HCL 4 MG/2ML IJ SOLN: 4.0000 mg | Freq: Once | INTRAMUSCULAR | Status: DC | PRN

## 2021-02-08 MED ORDER — FENTANYL CITRATE (PF) 100 MCG/2ML IJ SOLN
INTRAMUSCULAR | Status: AC
  Filled 2021-02-08: qty 2

## 2021-02-08 MED ORDER — PHENYLEPHRINE 40 MCG/ML (10ML) SYRINGE FOR IV PUSH (FOR BLOOD PRESSURE SUPPORT)
PREFILLED_SYRINGE | INTRAVENOUS | Status: DC | PRN
  Administered 2021-02-08: 120 ug via INTRAVENOUS
  Administered 2021-02-08: 160 ug via INTRAVENOUS
  Administered 2021-02-08: 120 ug via INTRAVENOUS

## 2021-02-08 MED ORDER — PHENYLEPHRINE 40 MCG/ML (10ML) SYRINGE FOR IV PUSH (FOR BLOOD PRESSURE SUPPORT)
PREFILLED_SYRINGE | INTRAVENOUS | Status: AC
  Filled 2021-02-08: qty 10

## 2021-02-08 MED ORDER — AMISULPRIDE (ANTIEMETIC) 5 MG/2ML IV SOLN: 10.0000 mg | Freq: Once | INTRAVENOUS | Status: DC | PRN

## 2021-02-08 MED ORDER — SUGAMMADEX SODIUM 200 MG/2ML IV SOLN
INTRAVENOUS | Status: DC | PRN
  Administered 2021-02-08: 300 mg via INTRAVENOUS

## 2021-02-08 MED ORDER — PHENYLEPHRINE HCL-NACL 10-0.9 MG/250ML-% IV SOLN
INTRAVENOUS | Status: DC | PRN
  Administered 2021-02-08: 25 ug/min via INTRAVENOUS

## 2021-02-08 MED ORDER — ONDANSETRON HCL 4 MG/2ML IJ SOLN
INTRAMUSCULAR | Status: AC
  Filled 2021-02-08: qty 2

## 2021-02-08 MED ORDER — ROCURONIUM BROMIDE 10 MG/ML (PF) SYRINGE
PREFILLED_SYRINGE | INTRAVENOUS | Status: DC | PRN
  Administered 2021-02-08: 100 mg via INTRAVENOUS

## 2021-02-08 MED ORDER — SODIUM CHLORIDE 0.9 % IV SOLN: INTRAVENOUS | Status: DC

## 2021-02-08 MED ORDER — MIDAZOLAM HCL 2 MG/2ML IJ SOLN
INTRAMUSCULAR | Status: DC | PRN
  Administered 2021-02-08: 2 mg via INTRAVENOUS

## 2021-02-08 MED ORDER — LACTATED RINGERS IV SOLN: INTRAVENOUS | Status: DC

## 2021-02-08 MED ORDER — DEXMEDETOMIDINE (PRECEDEX) IN NS 20 MCG/5ML (4 MCG/ML) IV SYRINGE
PREFILLED_SYRINGE | INTRAVENOUS | Status: AC
  Filled 2021-02-08: qty 5

## 2021-02-08 MED ORDER — MIDAZOLAM HCL 2 MG/2ML IJ SOLN
INTRAMUSCULAR | Status: AC
  Filled 2021-02-08: qty 2

## 2021-02-08 MED ORDER — FENTANYL CITRATE (PF) 250 MCG/5ML IJ SOLN
INTRAMUSCULAR | Status: AC
  Filled 2021-02-08: qty 5

## 2021-02-08 SURGICAL SUPPLY — 48 items
BLADE CLIPPER SURG (BLADE) IMPLANT
CANISTER SUCT 3000ML PPV (MISCELLANEOUS) ×2 IMPLANT
CATH FOLEY LATEX FREE 14FR (CATHETERS) ×2
CATH FOLEY LF 14FR (CATHETERS) IMPLANT
CHLORAPREP W/TINT 26 (MISCELLANEOUS) ×2 IMPLANT
COVER SURGICAL LIGHT HANDLE (MISCELLANEOUS) ×2 IMPLANT
CUTTER FLEX LINEAR 45M (STAPLE) ×1 IMPLANT
DRAPE LAPAROSCOPIC ABDOMINAL (DRAPES) ×2 IMPLANT
DRAPE UNIVERSAL (DRAPES) ×2 IMPLANT
DRAPE WARM FLUID 44X44 (DRAPES) ×2 IMPLANT
DRSG OPSITE POSTOP 4X10 (GAUZE/BANDAGES/DRESSINGS) IMPLANT
DRSG OPSITE POSTOP 4X12 (GAUZE/BANDAGES/DRESSINGS) ×1 IMPLANT
DRSG OPSITE POSTOP 4X8 (GAUZE/BANDAGES/DRESSINGS) ×1 IMPLANT
ELECT BLADE 6.5 EXT (BLADE) ×1 IMPLANT
ELECT CAUTERY BLADE 6.4 (BLADE) ×2 IMPLANT
ELECT REM PT RETURN 9FT ADLT (ELECTROSURGICAL) ×2
ELECTRODE REM PT RTRN 9FT ADLT (ELECTROSURGICAL) ×1 IMPLANT
GLOVE BIO SURGEON STRL SZ 6.5 (GLOVE) ×2 IMPLANT
GLOVE BIOGEL PI IND STRL 6 (GLOVE) ×1 IMPLANT
GLOVE BIOGEL PI INDICATOR 6 (GLOVE) ×1
GOWN STRL REUS W/ TWL LRG LVL3 (GOWN DISPOSABLE) ×2 IMPLANT
GOWN STRL REUS W/TWL LRG LVL3 (GOWN DISPOSABLE) ×4
HANDLE SUCTION POOLE (INSTRUMENTS) ×1 IMPLANT
KIT BASIN OR (CUSTOM PROCEDURE TRAY) ×2 IMPLANT
KIT TURNOVER KIT B (KITS) ×2 IMPLANT
LIGASURE IMPACT 36 18CM CVD LR (INSTRUMENTS) IMPLANT
NS IRRIG 1000ML POUR BTL (IV SOLUTION) ×4 IMPLANT
PACK GENERAL/GYN (CUSTOM PROCEDURE TRAY) ×2 IMPLANT
PAD ARMBOARD 7.5X6 YLW CONV (MISCELLANEOUS) ×2 IMPLANT
PENCIL SMOKE EVACUATOR (MISCELLANEOUS) ×2 IMPLANT
RELOAD 45 VASCULAR/THIN (ENDOMECHANICALS) ×4 IMPLANT
RELOAD STAPLE 45 2.5 WHT GRN (ENDOMECHANICALS) IMPLANT
RELOAD STAPLE 45 3.5 BLU ETS (ENDOMECHANICALS) IMPLANT
RELOAD STAPLE TA45 3.5 REG BLU (ENDOMECHANICALS) ×2 IMPLANT
SPONGE LAP 18X18 RF (DISPOSABLE) ×2 IMPLANT
STAPLER VISISTAT 35W (STAPLE) ×2 IMPLANT
SUCTION POOLE HANDLE (INSTRUMENTS) ×2
SUT PDS AB 1 TP1 54 (SUTURE) IMPLANT
SUT PDS AB 1 TP1 96 (SUTURE) ×2 IMPLANT
SUT SILK 0 SH 30 (SUTURE) ×3 IMPLANT
SUT SILK 2 0 SH CR/8 (SUTURE) ×2 IMPLANT
SUT SILK 2 0 TIES 10X30 (SUTURE) ×2 IMPLANT
SUT SILK 3 0 SH CR/8 (SUTURE) ×2 IMPLANT
SUT SILK 3 0 TIES 10X30 (SUTURE) ×2 IMPLANT
SUT VIC AB 3-0 SH 18 (SUTURE) IMPLANT
TOWEL GREEN STERILE (TOWEL DISPOSABLE) ×2 IMPLANT
TRAY FOLEY MTR SLVR 16FR STAT (SET/KITS/TRAYS/PACK) IMPLANT
YANKAUER SUCT BULB TIP NO VENT (SUCTIONS) ×1 IMPLANT

## 2021-02-08 NOTE — Anesthesia Procedure Notes (Signed)
Arterial Line Insertion Start/End5/24/2022 1:27 PM, 02/08/2021 1:30 PM Performed by: Lidia Collum, MD, Barrington Ellison, CRNA  Preanesthetic checklist: patient identified radial was placed Catheter size: 20 G Hand hygiene performed  and Seldinger technique used Allen's test indicative of satisfactory collateral circulation Attempts: 1 Procedure performed without using ultrasound guided technique. Following insertion, dressing applied and Biopatch. Post procedure assessment: normal  Patient tolerated the procedure well with no immediate complications. Additional procedure comments: Performed by Jacqualine Code .
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
## 2021-02-08 NOTE — Anesthesia Procedure Notes (Signed)
Procedure Name: Intubation Date/Time: 02/08/2021 12:42 PM Performed by: Barrington Ellison, CRNA Pre-anesthesia Checklist: Patient identified Patient Re-evaluated:Patient Re-evaluated prior to induction Oxygen Delivery Method: Circle system utilized Preoxygenation: Pre-oxygenation with 100% oxygen Induction Type: IV induction Ventilation: Mask ventilation without difficulty Laryngoscope Size: Miller and 2 Grade View: Grade I Tube type: Oral Tube size: 7.0 mm Number of attempts: 1 Airway Equipment and Method: Stylet Placement Confirmation: ETT inserted through vocal cords under direct vision,  positive ETCO2 and breath sounds checked- equal and bilateral Secured at: 21 cm Tube secured with: Tape Dental Injury: Teeth and Oropharynx as per pre-operative assessment  Comments: Performed by Jacqualine Code

## 2021-02-08 NOTE — Anesthesia Postprocedure Evaluation (Signed)
Anesthesia Post Note  Patient: Wendy [name]  Procedure(s) Performed: EXPLORATORY LAPAROTOMY (N/A Abdomen) SPLENECTOMY (N/A Abdomen) APPENDECTOMY (N/A Abdomen)     Patient location during evaluation: PACU Anesthesia Type: General Level of consciousness: awake and alert Pain management: pain level controlled Vital Signs Assessment: post-procedure vital signs reviewed and stable Respiratory status: spontaneous breathing, nonlabored ventilation and respiratory function stable Cardiovascular status: blood pressure returned to baseline and stable Postop Assessment: no apparent nausea or vomiting Anesthetic complications: no   No complications documented.  Last Vitals:  Vitals:   02/08/21 1440 02/08/21 1508  BP: (!) 153/79 (!) 149/80  Pulse: 99 96  Resp: 14   Temp: 36.4 C   SpO2: 95%     Last Pain:  Vitals:   02/08/21 1530  TempSrc:   PainSc: 10-Worst pain ever                 Lidia Collum

## 2021-02-08 NOTE — Transfer of Care (Signed)
Immediate Anesthesia Transfer of Care Note  Patient: [name]  Procedure(s) Performed: EXPLORATORY LAPAROTOMY (N/A Abdomen) SPLENECTOMY (N/A Abdomen) APPENDECTOMY (N/A Abdomen)  Patient Location: PACU  Anesthesia Type:General  Level of Consciousness: drowsy and patient cooperative  Airway & Oxygen Therapy: Patient Spontanous Breathing and Patient connected to nasal cannula oxygen  Post-op Assessment: Report given to RN and Post -op Vital signs reviewed and stable  Post vital signs: Reviewed and stable  Last Vitals:  Vitals Value Taken Time  BP 143/72 02/08/21 1410  Temp    Pulse 93 02/08/21 1413  Resp 18 02/08/21 1413  SpO2 96 % 02/08/21 1413  Vitals shown include unvalidated device data.  Last Pain:  Vitals:   02/08/21 1115  TempSrc:   PainSc: 3       Patients Stated Pain Goal: 0 (14/99/69 2493)  Complications: No complications documented.

## 2021-02-08 NOTE — Progress Notes (Signed)
PT Cancellation Note  Patient Details Name: Wendy [name] MRN: [id number] DOB: 1955/11/08   Cancelled Treatment:    Reason Eval/Treat Not Completed: Patient at procedure or test/unavailable. Pt currently in [place] and RN requesting that once pt returns to hold off on PT until tomorrow. Will plan to follow-up tomorrow as able.   Moishe Spice, PT, DPT Acute Rehabilitation Services  Pager: [phone] Office: [place] 02/08/2021, 12:41 PM
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
## 2021-02-09 ENCOUNTER — Encounter (HOSPITAL_COMMUNITY): Payer: Self-pay | Admitting: Surgery

## 2021-02-09 LAB — BASIC METABOLIC PANEL
Anion gap: 11 (ref 5–15)
BUN: <5 mg/dL — ABNORMAL LOW (ref 8–23)
CO2: 22 mmol/L (ref 22–32)
Calcium: 8.1 mg/dL — ABNORMAL LOW (ref 8.9–10.3)
Chloride: 105 mmol/L (ref 98–111)
Creatinine, Ser: 0.57 mg/dL (ref 0.44–1.00)
GFR, Estimated: >60 mL/min (ref >60)
Glucose, Bld: 112 mg/dL — ABNORMAL HIGH (ref 70–99)
Potassium: 3.3 mmol/L — ABNORMAL LOW (ref 3.5–5.1)
Sodium: 138 mmol/L (ref 135–145)

## 2021-02-09 LAB — TYPE AND SCREEN
ABO/RH(D): O POS
ABO/RH(D): O POS
Antibody Screen: NEGATIVE
Antibody Screen: NEGATIVE
Unit division: 0
Unit division: 0
Unit division: 0
Unit division: 0
Unit division: 0

## 2021-02-09 LAB — BPAM RBC
Blood Product Expiration Date: 202206132359
Blood Product Expiration Date: 202206132359
Blood Product Expiration Date: 202206142359
Blood Product Expiration Date: 202206202359
Blood Product Expiration Date: 202206202359
ISSUE DATE / TIME: 202205210128
ISSUE DATE / TIME: 202205210128
ISSUE DATE / TIME: 202205230041
ISSUE DATE / TIME: 202205241323
ISSUE DATE / TIME: 202205241323
Unit Type and Rh: 5100
Unit Type and Rh: 5100
Unit Type and Rh: 5100
Unit Type and Rh: 5100
Unit Type and Rh: 5100

## 2021-02-09 LAB — CBC
HCT: 35.3 % — ABNORMAL LOW (ref 36.0–46.0)
Hemoglobin: 11.4 g/dL — ABNORMAL LOW (ref 12.0–15.0)
MCH: 29.2 pg (ref 26.0–34.0)
MCHC: 32.3 g/dL (ref 30.0–36.0)
MCV: 90.5 fL (ref 80.0–100.0)
Platelets: 450 10*3/uL — ABNORMAL HIGH (ref 150–400)
RBC: 3.9 MIL/uL (ref 3.87–5.11)
RDW: 14.7 % (ref 11.5–15.5)
WBC: 16.8 10*3/uL — ABNORMAL HIGH (ref 4.0–10.5)
nRBC: 0.1 % (ref 0.0–0.2)

## 2021-02-09 LAB — MAGNESIUM: Magnesium: 1.4 mg/dL — ABNORMAL LOW (ref 1.7–2.4)

## 2021-02-09 LAB — PHOSPHORUS: Phosphorus: 3.3 mg/dL (ref 2.5–4.6)

## 2021-02-09 MED ORDER — METHOCARBAMOL 500 MG PO TABS
1000.0000 mg | ORAL_TABLET | Freq: Three times a day (TID) | ORAL | Status: DC
  Administered 2021-02-09 – 2021-02-12 (×10): 1000 mg via ORAL
  Filled 2021-02-09 (×10): qty 2

## 2021-02-09 MED ORDER — POTASSIUM CHLORIDE 20 MEQ PO PACK
40.0000 meq | PACK | ORAL | Status: AC
  Administered 2021-02-09 (×2): 40 meq via ORAL
  Filled 2021-02-09 (×2): qty 2

## 2021-02-09 MED ORDER — HYDROMORPHONE HCL 1 MG/ML IJ SOLN
0.5000 mg | INTRAMUSCULAR | Status: DC | PRN
Start: 2021-02-09 — End: 2021-02-10

## 2021-02-09 MED ORDER — KETOROLAC TROMETHAMINE 30 MG/ML IJ SOLN
30.0000 mg | Freq: Once | INTRAMUSCULAR | Status: AC
  Administered 2021-02-09: 30 mg via INTRAVENOUS
  Filled 2021-02-09: qty 1

## 2021-02-09 MED ORDER — ENOXAPARIN SODIUM 30 MG/0.3ML IJ SOSY
30.0000 mg | PREFILLED_SYRINGE | Freq: Two times a day (BID) | INTRAMUSCULAR | Status: DC
  Administered 2021-02-09 – 2021-02-12 (×7): 30 mg via SUBCUTANEOUS
  Filled 2021-02-09 (×7): qty 0.3

## 2021-02-09 MED ORDER — OXYCODONE HCL 5 MG PO TABS
10.0000 mg | ORAL_TABLET | ORAL | Status: DC | PRN
  Administered 2021-02-09: 15 mg via ORAL
  Administered 2021-02-09: 10 mg via ORAL
  Administered 2021-02-10 – 2021-02-11 (×5): 15 mg via ORAL
  Filled 2021-02-09: qty 2
  Filled 2021-02-09 (×6): qty 3

## 2021-02-09 MED ORDER — KETOROLAC TROMETHAMINE 15 MG/ML IJ SOLN
30.0000 mg | Freq: Four times a day (QID) | INTRAMUSCULAR | Status: DC
  Administered 2021-02-09 – 2021-02-12 (×13): 30 mg via INTRAVENOUS
  Filled 2021-02-09 (×13): qty 2

## 2021-02-09 NOTE — Progress Notes (Signed)
States that pain is uncontrolled despite pain medications that have been administered per PRN orders. Contacted Dr. Rosendo Gros on call. See new order. Continuing to monitor.

## 2021-02-09 NOTE — Progress Notes (Signed)
Pt roomed to unit at 1858hrs as a transfer from [employer]
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
## 2021-02-10 LAB — CBC
HCT: 28.9 % — ABNORMAL LOW (ref 36.0–46.0)
HCT: 29.9 % — ABNORMAL LOW (ref 36.0–46.0)
Hemoglobin: 9.3 g/dL — ABNORMAL LOW (ref 12.0–15.0)
Hemoglobin: 9.2 g/dL — ABNORMAL LOW (ref 12.0–15.0)
MCH: 29 pg (ref 26.0–34.0)
MCH: 28.3 pg (ref 26.0–34.0)
MCHC: 32.2 g/dL (ref 30.0–36.0)
MCHC: 30.8 g/dL (ref 30.0–36.0)
MCV: 90 fL (ref 80.0–100.0)
MCV: 92 fL (ref 80.0–100.0)
Platelets: 519 10*3/uL — ABNORMAL HIGH (ref 150–400)
Platelets: 495 10*3/uL — ABNORMAL HIGH (ref 150–400)
RBC: 3.21 MIL/uL — ABNORMAL LOW (ref 3.87–5.11)
RBC: 3.25 MIL/uL — ABNORMAL LOW (ref 3.87–5.11)
RDW: 14.5 % (ref 11.5–15.5)
RDW: 14.6 % (ref 11.5–15.5)
WBC: 9.6 10*3/uL (ref 4.0–10.5)
WBC: 11.2 10*3/uL — ABNORMAL HIGH (ref 4.0–10.5)
nRBC: 0.4 % — ABNORMAL HIGH (ref 0.0–0.2)
nRBC: 0.4 % — ABNORMAL HIGH (ref 0.0–0.2)

## 2021-02-10 LAB — SURGICAL PATHOLOGY

## 2021-02-10 MED ORDER — HAEMOPHILUS B POLYSAC CONJ VAC IM SOLR
0.5000 mL | INTRAMUSCULAR | Status: AC | PRN
  Administered 2021-02-12: 0.5 mL via INTRAMUSCULAR
  Filled 2021-02-10 (×2): qty 0.5

## 2021-02-10 MED ORDER — PNEUMOCOCCAL 13-VAL CONJ VACC IM SUSP
0.5000 mL | INTRAMUSCULAR | Status: AC | PRN
  Administered 2021-02-12: 0.5 mL via INTRAMUSCULAR
  Filled 2021-02-10 (×3): qty 0.5

## 2021-02-10 MED ORDER — MENINGOCOCCAL A C Y&W-135 OLIG IM SOLR
0.5000 mL | INTRAMUSCULAR | Status: AC | PRN
  Administered 2021-02-12: 0.5 mL via INTRAMUSCULAR
  Filled 2021-02-10 (×4): qty 0.5

## 2021-02-10 MED ORDER — POLYETHYLENE GLYCOL 3350 17 G PO PACK
17.0000 g | PACK | Freq: Every day | ORAL | Status: DC
  Filled 2021-02-10: qty 1

## 2021-02-10 MED ORDER — PHENYLEPHRINE IN HARD FAT 0.25 % RE SUPP
1.0000 | Freq: Once | RECTAL | Status: AC
  Administered 2021-02-10: 1 via RECTAL
  Filled 2021-02-10: qty 1

## 2021-02-10 MED ORDER — HYDROMORPHONE HCL 1 MG/ML IJ SOLN: 0.5000 mg | Freq: Three times a day (TID) | INTRAMUSCULAR | Status: DC | PRN
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
## 2021-02-10 NOTE — Discharge Instructions (Signed)
[facility]      [facility] [phone]  OPEN ABDOMINAL SURGERY: POST OP INSTRUCTIONS  Always review your discharge instruction sheet given to you by the facility where your surgery was performed.  IF YOU HAVE DISABILITY OR FAMILY LEAVE FORMS, YOU MUST BRING THEM TO THE OFFICE FOR PROCESSING.  PLEASE DO NOT GIVE THEM TO YOUR DOCTOR.  1. A prescription for pain medication may be given to you upon discharge.  Take your pain medication as prescribed, if needed.  If narcotic pain medicine is not needed, then you may take acetaminophen (Tylenol) or ibuprofen (Advil) as needed. 2. Take your usually prescribed medications unless otherwise directed. 3. If you need a refill on your pain medication, please contact your pharmacy. They will contact our office to request authorization.  Prescriptions will not be filled after 5pm or on week-ends. 4. You should follow a light diet the first few days after arrival home, such as soup and crackers, pudding, etc.unless your doctor has advised otherwise. A high-fiber, low fat diet can be resumed as tolerated.   Be sure to include lots of fluids daily. Most patients will experience some swelling and bruising on the chest and neck area.  Ice packs will help.  Swelling and bruising can take several days to resolve 5. Most patients will experience some swelling and bruising in the area of the incision. Ice pack will help. Swelling and bruising can take several days to resolve..  6. It is common to experience some constipation if taking pain medication after surgery.  Increasing fluid intake and taking a stool softener will usually help or prevent this problem from occurring.  A mild laxative (Milk of Magnesia or Miralax) should be taken according to package directions if there are no bowel movements after 48 hours. 7.  You may have steri-strips (small skin tapes) in place directly over the incision.  These strips should be left on the skin for 7-10 days.  If your  surgeon used skin glue on the incision, you may shower in 24 hours.  The glue will flake off over the next 2-3 weeks.  Any sutures or staples will be removed at the office during your follow-up visit. You may find that a light gauze bandage over your incision may keep your staples from being rubbed or pulled. You may shower and replace the bandage daily. 8. ACTIVITIES:  You may resume regular (light) daily activities beginning the next day--such as daily self-care, walking, climbing stairs--gradually increasing activities as tolerated.  You may have sexual intercourse when it is comfortable.  Refrain from any heavy lifting or straining until approved by your doctor. a. You may drive when you no longer are taking prescription pain medication, you can comfortably wear a seatbelt, and you can safely maneuver your car and apply brakes b. Return to Work: ___________________________________ 9. You should see your doctor in the office for a follow-up appointment approximately two weeks after your surgery.  Make sure that you call for this appointment within a day or two after you arrive home to insure a convenient appointment time. OTHER INSTRUCTIONS:  _____________________________________________________________ _____________________________________________________________  WHEN TO CALL YOUR DOCTOR: 1. Fever over 101.0 2. Inability to urinate 3. Nausea and/or vomiting 4. Extreme swelling or bruising 5. Continued bleeding from incision. 6. Increased pain, redness, or drainage from the incision. 7. Difficulty swallowing or breathing 8. Muscle cramping or spasms. 9. Numbness or tingling in hands or feet or around lips.  The clinic staff is available to   answer your questions during regular business hours.  Please don't hesitate to call and ask to speak to one of the nurses if you have concerns.  For further questions, please visit [email or web address]   Open Splenectomy, Care After This  sheet gives you information about how to care for yourself after your procedure. Your health care provider may also give you more specific instructions. If you have problems or questions, contact your health care provider. What can I expect after the procedure? After the procedure, it is common to have:  Mild abdominal pain.  Lack of energy. Follow these instructions at home: Medicines  Take over-the-counter and prescription medicines only as told by your health care provider.  Ask your health care provider if the medicine prescribed to you: ? Requires you to avoid driving or using heavy machinery. ? Can cause constipation. You may need to take actions to prevent or treat constipation, such as:  Take over-the-counter or prescription medicines.  Eat foods that are high in fiber, such as beans, whole grains, and fresh fruits and vegetables.  Limit foods that are high in fat and processed sugars, such as fried or sweet foods.  If you were prescribed an antibiotic medicine, take it as told by your health care provider. Do not stop taking the antibiotic even if you start to feel better.  Talk with your health care provider about the need for vaccinations to help prevent infections. Not having a spleen may affect your body's ability to fight infections and can make certain infections more dangerous. Incision care  Follow instructions from your health care provider about how to take care of your incision. Make sure you: ? Wash your hands with soap and water before and after you change your bandage (dressing). If soap and water are not available, use hand sanitizer. ? Change your dressing as told by your health care provider. ? Leave stitches (sutures), skin glue, or adhesive strips in place. These skin closures may need to be in place for 2 weeks or longer. If adhesive strip edges start to loosen and curl up, you may trim the loose edges. Do not remove adhesive strips completely unless your  health care provider tells you to do that.  Check your incision area every day for signs of infection. Check for: ? Redness, swelling, or pain. ? Fluid or blood. ? Warmth. ? Pus or a bad smell.  If you were sent home with a surgical drain in place, follow instructions from your health care provider about how to care for it.  Do not take baths, swim, or use a hot tub until your health care provider approves. Ask your health care provider if you may take showers. You may only be allowed to take sponge baths.   Eating and drinking  Drink enough fluid to keep your urine pale yellow.  Return to your normal diet as told by your health care provider. Activity  Return to your normal activities as told by your health care provider. Ask your health care provider what activities are safe for you.  Avoid strenuous activity for 4 weeks or as told by your health care provider.  Do not lift anything that is heavier than 10 lb (4.5 kg), or the limit that you are told, until your health care provider says that it is safe.  Rest as told by your health care provider.  Avoid sitting for a long time without moving. Get up to take short walks every 1-2 hours. This is important to  improve blood flow and breathing. Ask for help if you feel weak or unsteady.  Ask your health care provider when it is safe to drive and go back to work.   General instructions  Continue to practice deep breathing as told by your health care provider.  Always tell your health care providers that you do not have a spleen before you have any procedures. These include medical and dental procedures.  Keep all follow-up visits as told by your health care provider. This is important. Contact a health care provider if:  You have pain that is not helped by medicine.  You have a fever or chills.  You have redness, swelling, or pain around your incision.  You have fluid or blood coming from your incision.  Your incision feels  warm to the touch.  You have pus or a bad smell coming from your incision.  You have nausea or vomiting. Get help right away if:  Your pain gets much worse.  Your legs are red, swollen, or painful.  You have chest pain.  You have trouble breathing.  You suddenly feel very weak or dizzy. Summary  Get up to take short walks every 1-2 hours. This is important to improve blood flow and breathing.  Check your incision area every day for signs of infection, such as redness or swelling.  Talk with your health care provider about whether you need vaccinations to help prevent infections.  Do not lift anything that is heavier than 10 lb (4.5 kg), or the limit that you are told, until your health care provider says that it is safe. This information is not intended to replace advice given to you by your health care provider. Make sure you discuss any questions you have with your health care provider. Document Revised: 07/04/2018 Document Reviewed: 07/04/2018 Elsevier Patient Education  2021 [employer].

## 2021-02-10 NOTE — Plan of Care (Signed)
  Problem: Education: Goal: Knowledge of General Education information will improve Description Including pain rating scale, medication(s)/side effects and non-pharmacologic comfort measures Outcome: Progressing   
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
## 2021-02-11 LAB — CBC
HCT: 25.3 % — ABNORMAL LOW (ref 36.0–46.0)
HCT: 28.6 % — ABNORMAL LOW (ref 36.0–46.0)
Hemoglobin: 8.1 g/dL — ABNORMAL LOW (ref 12.0–15.0)
Hemoglobin: 9.2 g/dL — ABNORMAL LOW (ref 12.0–15.0)
MCH: 28.9 pg (ref 26.0–34.0)
MCH: 28.7 pg (ref 26.0–34.0)
MCHC: 32 g/dL (ref 30.0–36.0)
MCHC: 32.2 g/dL (ref 30.0–36.0)
MCV: 90.4 fL (ref 80.0–100.0)
MCV: 89.1 fL (ref 80.0–100.0)
Platelets: 513 10*3/uL — ABNORMAL HIGH (ref 150–400)
Platelets: 570 10*3/uL — ABNORMAL HIGH (ref 150–400)
RBC: 2.8 MIL/uL — ABNORMAL LOW (ref 3.87–5.11)
RBC: 3.21 MIL/uL — ABNORMAL LOW (ref 3.87–5.11)
RDW: 14.3 % (ref 11.5–15.5)
RDW: 14.4 % (ref 11.5–15.5)
WBC: 8.1 10*3/uL (ref 4.0–10.5)
WBC: 8.5 10*3/uL (ref 4.0–10.5)
nRBC: 0.4 % — ABNORMAL HIGH (ref 0.0–0.2)
nRBC: 0.2 % (ref 0.0–0.2)

## 2021-02-11 MED ORDER — POLYETHYLENE GLYCOL 3350 17 G PO PACK
17.0000 g | PACK | Freq: Two times a day (BID) | ORAL | Status: DC
  Filled 2021-02-11: qty 1

## 2021-02-11 MED ORDER — MAGNESIUM CITRATE PO SOLN
0.5000 | Freq: Once | ORAL | Status: AC
  Administered 2021-02-11: 0.5 via ORAL
  Filled 2021-02-11: qty 296

## 2021-02-11 MED ORDER — POLYETHYLENE GLYCOL 3350 17 G PO PACK
17.0000 g | PACK | Freq: Two times a day (BID) | ORAL | Status: DC
  Administered 2021-02-11 (×2): 17 g via ORAL
  Filled 2021-02-11 (×2): qty 1

## 2021-02-11 MED ORDER — DOCUSATE SODIUM 100 MG PO CAPS: 100.0000 mg | ORAL_CAPSULE | Freq: Two times a day (BID) | ORAL | 0 refills | Status: AC

## 2021-02-11 MED ORDER — METHOCARBAMOL 500 MG PO TABS: 1000.0000 mg | ORAL_TABLET | Freq: Three times a day (TID) | ORAL | 0 refills | Status: AC | PRN

## 2021-02-11 MED ORDER — POLYETHYLENE GLYCOL 3350 17 G PO PACK: 17.0000 g | PACK | Freq: Two times a day (BID) | ORAL | Status: DC

## 2021-02-11 MED ORDER — OXYCODONE HCL 10 MG PO TABS: 15.0000 mg | ORAL_TABLET | Freq: Four times a day (QID) | ORAL | 0 refills | Status: AC | PRN

## 2021-02-11 NOTE — Plan of Care (Signed)
  Problem: Health Behavior/Discharge Planning: Goal: Ability to manage health-related needs will improve Outcome: Progressing   
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
## 2021-02-12 LAB — CBC
HCT: 26.3 % — ABNORMAL LOW (ref 36.0–46.0)
Hemoglobin: 8.4 g/dL — ABNORMAL LOW (ref 12.0–15.0)
MCH: 28.9 pg (ref 26.0–34.0)
MCHC: 31.9 g/dL (ref 30.0–36.0)
MCV: 90.4 fL (ref 80.0–100.0)
Platelets: 618 10*3/uL — ABNORMAL HIGH (ref 150–400)
RBC: 2.91 MIL/uL — ABNORMAL LOW (ref 3.87–5.11)
RDW: 14.2 % (ref 11.5–15.5)
WBC: 7.2 10*3/uL (ref 4.0–10.5)
nRBC: 0 % (ref 0.0–0.2)

## 2021-02-12 MED ORDER — DIATRIZOATE MEGLUMINE & SODIUM 66-10 % PO SOLN
30.0000 mL | Freq: Once | ORAL | Status: AC
  Administered 2021-02-12: 30 mL via ORAL
  Filled 2021-02-12: qty 30

## 2021-02-12 MED ORDER — POLYETHYLENE GLYCOL 3350 17 G PO PACK: 17.0000 g | PACK | Freq: Every day | ORAL | Status: DC | PRN

## 2021-02-12 MED ORDER — PANTOPRAZOLE SODIUM 40 MG PO TBEC
40.0000 mg | DELAYED_RELEASE_TABLET | Freq: Every day | ORAL | Status: DC
  Administered 2021-02-12: 40 mg via ORAL
  Filled 2021-02-12: qty 1

## 2021-02-12 NOTE — Plan of Care (Signed)
  Problem: Education: Goal: Knowledge of General Education information will improve Description: Including pain rating scale, medication(s)/side effects and non-pharmacologic comfort measures Outcome: Progressing   Problem: Health Behavior/Discharge Planning: Goal: Ability to manage health-related needs will improve Outcome: Progressing   Problem: Clinical Measurements: Goal: Ability to maintain clinical measurements within normal limits will improve Outcome: Progressing Goal: Respiratory complications will improve Outcome: Progressing   Problem: Activity: Goal: Risk for activity intolerance will decrease Outcome: Progressing   Problem: Elimination: Goal: Will not experience complications related to bowel motility Outcome: Progressing Goal: Will not experience complications related to urinary retention Outcome: Progressing   Problem: Pain Managment: Goal: General experience of comfort will improve Outcome: Progressing   Problem: Safety: Goal: Ability to remain free from injury will improve Outcome: Progressing   Problem: Skin Integrity: Goal: Risk for impaired skin integrity will decrease Outcome: Progressing
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
## 2021-02-12 NOTE — Progress Notes (Signed)
AVS given and reviewed with pt. Splenic vaccines x3 admin prior to discharge, see MAR. Medications discussed. All questions answered to satisfaction. Pt verbalized understanding of information given. Pt escorted off the unit with all belongings via wheelchair by staff member.

## 2021-02-23 ENCOUNTER — Other Ambulatory Visit: Payer: Self-pay | Admitting: Family Medicine

## 2021-02-23 DIAGNOSIS — Z1231 Encounter for screening mammogram for malignant neoplasm of breast: Secondary | ICD-10-CM

## 2021-04-04 ENCOUNTER — Other Ambulatory Visit: Payer: Self-pay

## 2021-04-04 ENCOUNTER — Ambulatory Visit
Admission: RE | Admit: 2021-04-04 | Discharge: 2021-04-04 | Disposition: A | Discharge status: Home / Self Care | Payer: 59 | Source: Ambulatory Visit | Attending: Family Medicine | Admitting: Family Medicine

## 2021-04-04 DIAGNOSIS — Z1231 Encounter for screening mammogram for malignant neoplasm of breast: Secondary | ICD-10-CM

## 2021-05-11 DIAGNOSIS — M47817 Spondylosis without myelopathy or radiculopathy, lumbosacral region: Secondary | ICD-10-CM | POA: Diagnosis not present

## 2021-05-11 DIAGNOSIS — M9903 Segmental and somatic dysfunction of lumbar region: Secondary | ICD-10-CM | POA: Diagnosis not present

## 2021-05-11 DIAGNOSIS — G43119 Migraine with aura, intractable, without status migrainosus: Secondary | ICD-10-CM | POA: Diagnosis not present

## 2021-05-11 DIAGNOSIS — M9901 Segmental and somatic dysfunction of cervical region: Secondary | ICD-10-CM | POA: Diagnosis not present

## 2021-05-11 DIAGNOSIS — M609 Myositis, unspecified: Secondary | ICD-10-CM | POA: Diagnosis not present

## 2021-05-11 DIAGNOSIS — M47812 Spondylosis without myelopathy or radiculopathy, cervical region: Secondary | ICD-10-CM | POA: Diagnosis not present

## 2021-05-11 DIAGNOSIS — M9905 Segmental and somatic dysfunction of pelvic region: Secondary | ICD-10-CM | POA: Diagnosis not present

## 2021-05-11 DIAGNOSIS — M4125 Other idiopathic scoliosis, thoracolumbar region: Secondary | ICD-10-CM | POA: Diagnosis not present

## 2021-05-11 DIAGNOSIS — M545 Low back pain, unspecified: Secondary | ICD-10-CM | POA: Diagnosis not present

## 2021-05-11 DIAGNOSIS — M9902 Segmental and somatic dysfunction of thoracic region: Secondary | ICD-10-CM | POA: Diagnosis not present

## 2021-05-11 DIAGNOSIS — M9904 Segmental and somatic dysfunction of sacral region: Secondary | ICD-10-CM | POA: Diagnosis not present

## 2021-05-11 DIAGNOSIS — M953 Acquired deformity of neck: Secondary | ICD-10-CM | POA: Diagnosis not present

## 2021-05-16 DIAGNOSIS — M609 Myositis, unspecified: Secondary | ICD-10-CM | POA: Diagnosis not present

## 2021-05-16 DIAGNOSIS — M9904 Segmental and somatic dysfunction of sacral region: Secondary | ICD-10-CM | POA: Diagnosis not present

## 2021-05-16 DIAGNOSIS — G43119 Migraine with aura, intractable, without status migrainosus: Secondary | ICD-10-CM | POA: Diagnosis not present

## 2021-05-16 DIAGNOSIS — M9902 Segmental and somatic dysfunction of thoracic region: Secondary | ICD-10-CM | POA: Diagnosis not present

## 2021-05-16 DIAGNOSIS — M4125 Other idiopathic scoliosis, thoracolumbar region: Secondary | ICD-10-CM | POA: Diagnosis not present

## 2021-05-16 DIAGNOSIS — M545 Low back pain, unspecified: Secondary | ICD-10-CM | POA: Diagnosis not present

## 2021-05-16 DIAGNOSIS — M953 Acquired deformity of neck: Secondary | ICD-10-CM | POA: Diagnosis not present

## 2021-05-16 DIAGNOSIS — M9905 Segmental and somatic dysfunction of pelvic region: Secondary | ICD-10-CM | POA: Diagnosis not present

## 2021-05-16 DIAGNOSIS — M9901 Segmental and somatic dysfunction of cervical region: Secondary | ICD-10-CM | POA: Diagnosis not present

## 2021-05-16 DIAGNOSIS — M47812 Spondylosis without myelopathy or radiculopathy, cervical region: Secondary | ICD-10-CM | POA: Diagnosis not present

## 2021-05-16 DIAGNOSIS — M9903 Segmental and somatic dysfunction of lumbar region: Secondary | ICD-10-CM | POA: Diagnosis not present

## 2021-05-16 DIAGNOSIS — M47817 Spondylosis without myelopathy or radiculopathy, lumbosacral region: Secondary | ICD-10-CM | POA: Diagnosis not present

## 2021-05-25 DIAGNOSIS — M545 Low back pain, unspecified: Secondary | ICD-10-CM | POA: Diagnosis not present

## 2021-05-25 DIAGNOSIS — M47812 Spondylosis without myelopathy or radiculopathy, cervical region: Secondary | ICD-10-CM | POA: Diagnosis not present

## 2021-05-25 DIAGNOSIS — M609 Myositis, unspecified: Secondary | ICD-10-CM | POA: Diagnosis not present

## 2021-05-25 DIAGNOSIS — M9901 Segmental and somatic dysfunction of cervical region: Secondary | ICD-10-CM | POA: Diagnosis not present

## 2021-05-25 DIAGNOSIS — G43119 Migraine with aura, intractable, without status migrainosus: Secondary | ICD-10-CM | POA: Diagnosis not present

## 2021-05-25 DIAGNOSIS — M47817 Spondylosis without myelopathy or radiculopathy, lumbosacral region: Secondary | ICD-10-CM | POA: Diagnosis not present

## 2021-05-25 DIAGNOSIS — M9903 Segmental and somatic dysfunction of lumbar region: Secondary | ICD-10-CM | POA: Diagnosis not present

## 2021-05-25 DIAGNOSIS — M953 Acquired deformity of neck: Secondary | ICD-10-CM | POA: Diagnosis not present

## 2021-05-25 DIAGNOSIS — M9904 Segmental and somatic dysfunction of sacral region: Secondary | ICD-10-CM | POA: Diagnosis not present

## 2021-05-25 DIAGNOSIS — M9902 Segmental and somatic dysfunction of thoracic region: Secondary | ICD-10-CM | POA: Diagnosis not present

## 2021-05-25 DIAGNOSIS — M9905 Segmental and somatic dysfunction of pelvic region: Secondary | ICD-10-CM | POA: Diagnosis not present

## 2021-05-25 DIAGNOSIS — M4125 Other idiopathic scoliosis, thoracolumbar region: Secondary | ICD-10-CM | POA: Diagnosis not present

## 2021-05-31 ENCOUNTER — Other Ambulatory Visit: Payer: Self-pay | Admitting: Family Medicine

## 2021-05-31 DIAGNOSIS — Z23 Encounter for immunization: Secondary | ICD-10-CM | POA: Diagnosis not present

## 2021-05-31 DIAGNOSIS — I2510 Atherosclerotic heart disease of native coronary artery without angina pectoris: Secondary | ICD-10-CM

## 2021-05-31 DIAGNOSIS — M8580 Other specified disorders of bone density and structure, unspecified site: Secondary | ICD-10-CM

## 2021-05-31 DIAGNOSIS — J4520 Mild intermittent asthma, uncomplicated: Secondary | ICD-10-CM | POA: Diagnosis not present

## 2021-05-31 DIAGNOSIS — E7800 Pure hypercholesterolemia, unspecified: Secondary | ICD-10-CM | POA: Diagnosis not present

## 2021-05-31 DIAGNOSIS — Z0000 Encounter for general adult medical examination without abnormal findings: Secondary | ICD-10-CM | POA: Diagnosis not present

## 2021-05-31 DIAGNOSIS — S81852A Open bite, left lower leg, initial encounter: Secondary | ICD-10-CM | POA: Diagnosis not present

## 2021-05-31 DIAGNOSIS — M8588 Other specified disorders of bone density and structure, other site: Secondary | ICD-10-CM | POA: Diagnosis not present

## 2021-05-31 DIAGNOSIS — W5501XA Bitten by cat, initial encounter: Secondary | ICD-10-CM | POA: Diagnosis not present

## 2021-05-31 DIAGNOSIS — R911 Solitary pulmonary nodule: Secondary | ICD-10-CM | POA: Diagnosis not present

## 2021-05-31 DIAGNOSIS — R7303 Prediabetes: Secondary | ICD-10-CM | POA: Diagnosis not present

## 2021-06-01 DIAGNOSIS — M9904 Segmental and somatic dysfunction of sacral region: Secondary | ICD-10-CM | POA: Diagnosis not present

## 2021-06-01 DIAGNOSIS — M9901 Segmental and somatic dysfunction of cervical region: Secondary | ICD-10-CM | POA: Diagnosis not present

## 2021-06-01 DIAGNOSIS — M9903 Segmental and somatic dysfunction of lumbar region: Secondary | ICD-10-CM | POA: Diagnosis not present

## 2021-06-01 DIAGNOSIS — M47817 Spondylosis without myelopathy or radiculopathy, lumbosacral region: Secondary | ICD-10-CM | POA: Diagnosis not present

## 2021-06-01 DIAGNOSIS — M4125 Other idiopathic scoliosis, thoracolumbar region: Secondary | ICD-10-CM | POA: Diagnosis not present

## 2021-06-01 DIAGNOSIS — M609 Myositis, unspecified: Secondary | ICD-10-CM | POA: Diagnosis not present

## 2021-06-01 DIAGNOSIS — M953 Acquired deformity of neck: Secondary | ICD-10-CM | POA: Diagnosis not present

## 2021-06-01 DIAGNOSIS — M9902 Segmental and somatic dysfunction of thoracic region: Secondary | ICD-10-CM | POA: Diagnosis not present

## 2021-06-01 DIAGNOSIS — M545 Low back pain, unspecified: Secondary | ICD-10-CM | POA: Diagnosis not present

## 2021-06-01 DIAGNOSIS — G43119 Migraine with aura, intractable, without status migrainosus: Secondary | ICD-10-CM | POA: Diagnosis not present

## 2021-06-01 DIAGNOSIS — M47812 Spondylosis without myelopathy or radiculopathy, cervical region: Secondary | ICD-10-CM | POA: Diagnosis not present

## 2021-06-01 DIAGNOSIS — M9905 Segmental and somatic dysfunction of pelvic region: Secondary | ICD-10-CM | POA: Diagnosis not present

## 2021-06-09 DIAGNOSIS — M953 Acquired deformity of neck: Secondary | ICD-10-CM | POA: Diagnosis not present

## 2021-06-09 DIAGNOSIS — M9903 Segmental and somatic dysfunction of lumbar region: Secondary | ICD-10-CM | POA: Diagnosis not present

## 2021-06-09 DIAGNOSIS — M545 Low back pain, unspecified: Secondary | ICD-10-CM | POA: Diagnosis not present

## 2021-06-09 DIAGNOSIS — M9902 Segmental and somatic dysfunction of thoracic region: Secondary | ICD-10-CM | POA: Diagnosis not present

## 2021-06-09 DIAGNOSIS — M4125 Other idiopathic scoliosis, thoracolumbar region: Secondary | ICD-10-CM | POA: Diagnosis not present

## 2021-06-09 DIAGNOSIS — G43119 Migraine with aura, intractable, without status migrainosus: Secondary | ICD-10-CM | POA: Diagnosis not present

## 2021-06-09 DIAGNOSIS — M9901 Segmental and somatic dysfunction of cervical region: Secondary | ICD-10-CM | POA: Diagnosis not present

## 2021-06-09 DIAGNOSIS — M9905 Segmental and somatic dysfunction of pelvic region: Secondary | ICD-10-CM | POA: Diagnosis not present

## 2021-06-09 DIAGNOSIS — M9904 Segmental and somatic dysfunction of sacral region: Secondary | ICD-10-CM | POA: Diagnosis not present

## 2021-06-09 DIAGNOSIS — M609 Myositis, unspecified: Secondary | ICD-10-CM | POA: Diagnosis not present

## 2021-06-09 DIAGNOSIS — M47817 Spondylosis without myelopathy or radiculopathy, lumbosacral region: Secondary | ICD-10-CM | POA: Diagnosis not present

## 2021-06-09 DIAGNOSIS — M47812 Spondylosis without myelopathy or radiculopathy, cervical region: Secondary | ICD-10-CM | POA: Diagnosis not present

## 2021-06-14 DIAGNOSIS — M4125 Other idiopathic scoliosis, thoracolumbar region: Secondary | ICD-10-CM | POA: Diagnosis not present

## 2021-06-14 DIAGNOSIS — M9902 Segmental and somatic dysfunction of thoracic region: Secondary | ICD-10-CM | POA: Diagnosis not present

## 2021-06-14 DIAGNOSIS — M9903 Segmental and somatic dysfunction of lumbar region: Secondary | ICD-10-CM | POA: Diagnosis not present

## 2021-06-14 DIAGNOSIS — M47812 Spondylosis without myelopathy or radiculopathy, cervical region: Secondary | ICD-10-CM | POA: Diagnosis not present

## 2021-06-14 DIAGNOSIS — M609 Myositis, unspecified: Secondary | ICD-10-CM | POA: Diagnosis not present

## 2021-06-14 DIAGNOSIS — M9901 Segmental and somatic dysfunction of cervical region: Secondary | ICD-10-CM | POA: Diagnosis not present

## 2021-06-14 DIAGNOSIS — M47817 Spondylosis without myelopathy or radiculopathy, lumbosacral region: Secondary | ICD-10-CM | POA: Diagnosis not present

## 2021-06-14 DIAGNOSIS — M9905 Segmental and somatic dysfunction of pelvic region: Secondary | ICD-10-CM | POA: Diagnosis not present

## 2021-06-14 DIAGNOSIS — M9904 Segmental and somatic dysfunction of sacral region: Secondary | ICD-10-CM | POA: Diagnosis not present

## 2021-06-14 DIAGNOSIS — M545 Low back pain, unspecified: Secondary | ICD-10-CM | POA: Diagnosis not present

## 2021-06-14 DIAGNOSIS — M953 Acquired deformity of neck: Secondary | ICD-10-CM | POA: Diagnosis not present

## 2021-06-14 DIAGNOSIS — G43119 Migraine with aura, intractable, without status migrainosus: Secondary | ICD-10-CM | POA: Diagnosis not present

## 2021-06-22 ENCOUNTER — Ambulatory Visit
Admission: RE | Admit: 2021-06-22 | Discharge: 2021-06-22 | Disposition: A | Discharge status: Home / Self Care | Payer: No Typology Code available for payment source | Source: Ambulatory Visit | Attending: Family Medicine | Admitting: Family Medicine

## 2021-06-22 DIAGNOSIS — I2510 Atherosclerotic heart disease of native coronary artery without angina pectoris: Secondary | ICD-10-CM

## 2021-06-22 DIAGNOSIS — S61452A Open bite of left hand, initial encounter: Secondary | ICD-10-CM | POA: Diagnosis not present

## 2021-06-28 DIAGNOSIS — M9902 Segmental and somatic dysfunction of thoracic region: Secondary | ICD-10-CM | POA: Diagnosis not present

## 2021-06-28 DIAGNOSIS — M9901 Segmental and somatic dysfunction of cervical region: Secondary | ICD-10-CM | POA: Diagnosis not present

## 2021-06-28 DIAGNOSIS — G43119 Migraine with aura, intractable, without status migrainosus: Secondary | ICD-10-CM | POA: Diagnosis not present

## 2021-06-28 DIAGNOSIS — M545 Low back pain, unspecified: Secondary | ICD-10-CM | POA: Diagnosis not present

## 2021-06-28 DIAGNOSIS — M609 Myositis, unspecified: Secondary | ICD-10-CM | POA: Diagnosis not present

## 2021-06-28 DIAGNOSIS — M953 Acquired deformity of neck: Secondary | ICD-10-CM | POA: Diagnosis not present

## 2021-06-28 DIAGNOSIS — M9903 Segmental and somatic dysfunction of lumbar region: Secondary | ICD-10-CM | POA: Diagnosis not present

## 2021-06-28 DIAGNOSIS — M47812 Spondylosis without myelopathy or radiculopathy, cervical region: Secondary | ICD-10-CM | POA: Diagnosis not present

## 2021-06-28 DIAGNOSIS — M9905 Segmental and somatic dysfunction of pelvic region: Secondary | ICD-10-CM | POA: Diagnosis not present

## 2021-06-28 DIAGNOSIS — M9904 Segmental and somatic dysfunction of sacral region: Secondary | ICD-10-CM | POA: Diagnosis not present

## 2021-06-28 DIAGNOSIS — M47817 Spondylosis without myelopathy or radiculopathy, lumbosacral region: Secondary | ICD-10-CM | POA: Diagnosis not present

## 2021-06-28 DIAGNOSIS — M4125 Other idiopathic scoliosis, thoracolumbar region: Secondary | ICD-10-CM | POA: Diagnosis not present

## 2021-07-07 DIAGNOSIS — S61452D Open bite of left hand, subsequent encounter: Secondary | ICD-10-CM | POA: Diagnosis not present

## 2021-07-07 DIAGNOSIS — B3731 Acute candidiasis of vulva and vagina: Secondary | ICD-10-CM | POA: Diagnosis not present

## 2021-07-07 DIAGNOSIS — E7800 Pure hypercholesterolemia, unspecified: Secondary | ICD-10-CM | POA: Diagnosis not present

## 2021-08-05 DIAGNOSIS — W5501XA Bitten by cat, initial encounter: Secondary | ICD-10-CM | POA: Diagnosis not present

## 2021-08-05 DIAGNOSIS — S51851A Open bite of right forearm, initial encounter: Secondary | ICD-10-CM | POA: Diagnosis not present

## 2021-08-23 DIAGNOSIS — H2513 Age-related nuclear cataract, bilateral: Secondary | ICD-10-CM | POA: Diagnosis not present

## 2021-10-19 DIAGNOSIS — J4520 Mild intermittent asthma, uncomplicated: Secondary | ICD-10-CM | POA: Diagnosis not present

## 2021-10-19 DIAGNOSIS — R0981 Nasal congestion: Secondary | ICD-10-CM | POA: Diagnosis not present

## 2021-10-19 DIAGNOSIS — Z03818 Encounter for observation for suspected exposure to other biological agents ruled out: Secondary | ICD-10-CM | POA: Diagnosis not present

## 2021-10-19 DIAGNOSIS — E7800 Pure hypercholesterolemia, unspecified: Secondary | ICD-10-CM | POA: Diagnosis not present

## 2021-10-19 DIAGNOSIS — R7303 Prediabetes: Secondary | ICD-10-CM | POA: Diagnosis not present

## 2021-10-19 DIAGNOSIS — Z9081 Acquired absence of spleen: Secondary | ICD-10-CM | POA: Diagnosis not present

## 2021-11-14 ENCOUNTER — Ambulatory Visit
Admission: RE | Admit: 2021-11-14 | Discharge: 2021-11-14 | Disposition: A | Discharge status: Home / Self Care | Payer: Medicare Other | Source: Ambulatory Visit | Attending: Family Medicine | Admitting: Family Medicine

## 2021-11-14 DIAGNOSIS — M85851 Other specified disorders of bone density and structure, right thigh: Secondary | ICD-10-CM | POA: Diagnosis not present

## 2021-11-14 DIAGNOSIS — Z780 Asymptomatic menopausal state: Secondary | ICD-10-CM | POA: Diagnosis not present

## 2021-11-14 DIAGNOSIS — M8580 Other specified disorders of bone density and structure, unspecified site: Secondary | ICD-10-CM

## 2021-11-24 DIAGNOSIS — L821 Other seborrheic keratosis: Secondary | ICD-10-CM | POA: Diagnosis not present

## 2021-11-24 DIAGNOSIS — Z23 Encounter for immunization: Secondary | ICD-10-CM | POA: Diagnosis not present

## 2022-02-02 DIAGNOSIS — H905 Unspecified sensorineural hearing loss: Secondary | ICD-10-CM | POA: Diagnosis not present

## 2022-03-17 DIAGNOSIS — N390 Urinary tract infection, site not specified: Secondary | ICD-10-CM | POA: Diagnosis not present

## 2022-03-17 DIAGNOSIS — R300 Dysuria: Secondary | ICD-10-CM | POA: Diagnosis not present

## 2022-03-23 ENCOUNTER — Other Ambulatory Visit: Payer: Self-pay | Admitting: Family Medicine

## 2022-03-23 DIAGNOSIS — Z1231 Encounter for screening mammogram for malignant neoplasm of breast: Secondary | ICD-10-CM

## 2022-04-12 ENCOUNTER — Ambulatory Visit: Payer: Medicare Other

## 2022-04-18 DIAGNOSIS — L578 Other skin changes due to chronic exposure to nonionizing radiation: Secondary | ICD-10-CM | POA: Diagnosis not present

## 2022-04-18 DIAGNOSIS — L814 Other melanin hyperpigmentation: Secondary | ICD-10-CM | POA: Diagnosis not present

## 2022-04-18 DIAGNOSIS — D225 Melanocytic nevi of trunk: Secondary | ICD-10-CM | POA: Diagnosis not present

## 2022-04-18 DIAGNOSIS — L821 Other seborrheic keratosis: Secondary | ICD-10-CM | POA: Diagnosis not present

## 2022-04-18 DIAGNOSIS — D2261 Melanocytic nevi of right upper limb, including shoulder: Secondary | ICD-10-CM | POA: Diagnosis not present

## 2022-04-18 DIAGNOSIS — D2271 Melanocytic nevi of right lower limb, including hip: Secondary | ICD-10-CM | POA: Diagnosis not present

## 2022-04-28 ENCOUNTER — Ambulatory Visit
Admission: RE | Admit: 2022-04-28 | Discharge: 2022-04-28 | Disposition: A | Discharge status: Home / Self Care | Payer: Medicare Other | Source: Ambulatory Visit | Attending: Family Medicine | Admitting: Family Medicine

## 2022-04-28 DIAGNOSIS — Z1231 Encounter for screening mammogram for malignant neoplasm of breast: Secondary | ICD-10-CM

## 2022-05-19 DIAGNOSIS — W19XXXA Unspecified fall, initial encounter: Secondary | ICD-10-CM | POA: Diagnosis not present

## 2022-05-19 DIAGNOSIS — S79911A Unspecified injury of right hip, initial encounter: Secondary | ICD-10-CM | POA: Diagnosis not present

## 2022-05-30 DIAGNOSIS — J0100 Acute maxillary sinusitis, unspecified: Secondary | ICD-10-CM | POA: Diagnosis not present

## 2022-05-30 DIAGNOSIS — H6993 Unspecified Eustachian tube disorder, bilateral: Secondary | ICD-10-CM | POA: Diagnosis not present

## 2022-06-08 DIAGNOSIS — Z0000 Encounter for general adult medical examination without abnormal findings: Secondary | ICD-10-CM | POA: Diagnosis not present

## 2022-06-08 DIAGNOSIS — R7303 Prediabetes: Secondary | ICD-10-CM | POA: Diagnosis not present

## 2022-06-08 DIAGNOSIS — E7800 Pure hypercholesterolemia, unspecified: Secondary | ICD-10-CM | POA: Diagnosis not present

## 2022-06-08 DIAGNOSIS — Z9081 Acquired absence of spleen: Secondary | ICD-10-CM | POA: Diagnosis not present

## 2022-06-08 DIAGNOSIS — J4520 Mild intermittent asthma, uncomplicated: Secondary | ICD-10-CM | POA: Diagnosis not present

## 2022-06-08 DIAGNOSIS — Z23 Encounter for immunization: Secondary | ICD-10-CM | POA: Diagnosis not present

## 2022-06-08 DIAGNOSIS — M549 Dorsalgia, unspecified: Secondary | ICD-10-CM | POA: Diagnosis not present

## 2022-07-27 DIAGNOSIS — R748 Abnormal levels of other serum enzymes: Secondary | ICD-10-CM | POA: Diagnosis not present

## 2022-09-13 DIAGNOSIS — Z9081 Acquired absence of spleen: Secondary | ICD-10-CM | POA: Diagnosis not present

## 2022-09-13 DIAGNOSIS — Z8709 Personal history of other diseases of the respiratory system: Secondary | ICD-10-CM | POA: Diagnosis not present

## 2022-09-13 DIAGNOSIS — J40 Bronchitis, not specified as acute or chronic: Secondary | ICD-10-CM | POA: Diagnosis not present

## 2022-09-13 DIAGNOSIS — R509 Fever, unspecified: Secondary | ICD-10-CM | POA: Diagnosis not present

## 2022-09-13 DIAGNOSIS — Z03818 Encounter for observation for suspected exposure to other biological agents ruled out: Secondary | ICD-10-CM | POA: Diagnosis not present

## 2022-09-20 DIAGNOSIS — H2513 Age-related nuclear cataract, bilateral: Secondary | ICD-10-CM | POA: Diagnosis not present

## 2022-12-05 DIAGNOSIS — S61431A Puncture wound without foreign body of right hand, initial encounter: Secondary | ICD-10-CM | POA: Diagnosis not present

## 2022-12-05 DIAGNOSIS — W5501XA Bitten by cat, initial encounter: Secondary | ICD-10-CM | POA: Diagnosis not present
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
## 2023-03-30 ENCOUNTER — Other Ambulatory Visit: Payer: Self-pay | Admitting: Internal Medicine

## 2023-03-30 DIAGNOSIS — Z1231 Encounter for screening mammogram for malignant neoplasm of breast: Secondary | ICD-10-CM

## 2023-04-19 DIAGNOSIS — D2271 Melanocytic nevi of right lower limb, including hip: Secondary | ICD-10-CM | POA: Diagnosis not present

## 2023-04-19 DIAGNOSIS — L814 Other melanin hyperpigmentation: Secondary | ICD-10-CM | POA: Diagnosis not present

## 2023-04-19 DIAGNOSIS — L821 Other seborrheic keratosis: Secondary | ICD-10-CM | POA: Diagnosis not present

## 2023-04-19 DIAGNOSIS — D692 Other nonthrombocytopenic purpura: Secondary | ICD-10-CM | POA: Diagnosis not present

## 2023-04-19 DIAGNOSIS — Z85828 Personal history of other malignant neoplasm of skin: Secondary | ICD-10-CM | POA: Diagnosis not present

## 2023-04-19 DIAGNOSIS — L578 Other skin changes due to chronic exposure to nonionizing radiation: Secondary | ICD-10-CM | POA: Diagnosis not present

## 2023-04-19 DIAGNOSIS — Z86018 Personal history of other benign neoplasm: Secondary | ICD-10-CM | POA: Diagnosis not present

## 2023-04-19 DIAGNOSIS — D485 Neoplasm of uncertain behavior of skin: Secondary | ICD-10-CM | POA: Diagnosis not present

## 2023-04-19 DIAGNOSIS — D225 Melanocytic nevi of trunk: Secondary | ICD-10-CM | POA: Diagnosis not present

## 2023-04-19 DIAGNOSIS — D2261 Melanocytic nevi of right upper limb, including shoulder: Secondary | ICD-10-CM | POA: Diagnosis not present

## 2023-04-30 DIAGNOSIS — D239 Other benign neoplasm of skin, unspecified: Secondary | ICD-10-CM | POA: Diagnosis not present

## 2023-04-30 DIAGNOSIS — D2371 Other benign neoplasm of skin of right lower limb, including hip: Secondary | ICD-10-CM | POA: Diagnosis not present

## 2023-05-01 ENCOUNTER — Ambulatory Visit: Admission: RE | Admit: 2023-05-01 | Payer: Medicare Other | Source: Ambulatory Visit

## 2023-05-01 DIAGNOSIS — Z1231 Encounter for screening mammogram for malignant neoplasm of breast: Secondary | ICD-10-CM

## 2023-07-18 ENCOUNTER — Other Ambulatory Visit (HOSPITAL_COMMUNITY): Payer: Self-pay

## 2023-10-05 DIAGNOSIS — H2513 Age-related nuclear cataract, bilateral: Secondary | ICD-10-CM | POA: Diagnosis not present

## 2024-02-16 DIAGNOSIS — I2510 Atherosclerotic heart disease of native coronary artery without angina pectoris: Secondary | ICD-10-CM | POA: Diagnosis not present

## 2024-02-16 DIAGNOSIS — M19049 Primary osteoarthritis, unspecified hand: Secondary | ICD-10-CM | POA: Diagnosis not present

## 2024-02-16 DIAGNOSIS — J4520 Mild intermittent asthma, uncomplicated: Secondary | ICD-10-CM | POA: Diagnosis not present

## 2024-02-16 DIAGNOSIS — E7800 Pure hypercholesterolemia, unspecified: Secondary | ICD-10-CM | POA: Diagnosis not present

## 2024-03-17 DIAGNOSIS — J4520 Mild intermittent asthma, uncomplicated: Secondary | ICD-10-CM | POA: Diagnosis not present

## 2024-03-17 DIAGNOSIS — M19049 Primary osteoarthritis, unspecified hand: Secondary | ICD-10-CM | POA: Diagnosis not present

## 2024-03-17 DIAGNOSIS — E7800 Pure hypercholesterolemia, unspecified: Secondary | ICD-10-CM | POA: Diagnosis not present

## 2024-03-17 DIAGNOSIS — I2510 Atherosclerotic heart disease of native coronary artery without angina pectoris: Secondary | ICD-10-CM | POA: Diagnosis not present

## 2024-04-17 DIAGNOSIS — J4520 Mild intermittent asthma, uncomplicated: Secondary | ICD-10-CM | POA: Diagnosis not present

## 2024-04-17 DIAGNOSIS — E7800 Pure hypercholesterolemia, unspecified: Secondary | ICD-10-CM | POA: Diagnosis not present

## 2024-04-17 DIAGNOSIS — M19049 Primary osteoarthritis, unspecified hand: Secondary | ICD-10-CM | POA: Diagnosis not present

## 2024-04-17 DIAGNOSIS — I2510 Atherosclerotic heart disease of native coronary artery without angina pectoris: Secondary | ICD-10-CM | POA: Diagnosis not present

## 2024-04-25 ENCOUNTER — Other Ambulatory Visit: Payer: Self-pay | Admitting: Internal Medicine

## 2024-04-25 DIAGNOSIS — Z1231 Encounter for screening mammogram for malignant neoplasm of breast: Secondary | ICD-10-CM

## 2024-05-21 ENCOUNTER — Ambulatory Visit
Admission: RE | Admit: 2024-05-21 | Discharge: 2024-05-21 | Disposition: A | Discharge status: Home / Self Care | Source: Ambulatory Visit | Attending: Internal Medicine | Admitting: Internal Medicine

## 2024-05-21 DIAGNOSIS — Z1231 Encounter for screening mammogram for malignant neoplasm of breast: Secondary | ICD-10-CM | POA: Diagnosis not present

## 2024-06-11 DIAGNOSIS — M8588 Other specified disorders of bone density and structure, other site: Secondary | ICD-10-CM | POA: Diagnosis not present

## 2024-06-11 DIAGNOSIS — R7303 Prediabetes: Secondary | ICD-10-CM | POA: Diagnosis not present

## 2024-06-11 DIAGNOSIS — E7800 Pure hypercholesterolemia, unspecified: Secondary | ICD-10-CM | POA: Diagnosis not present

## 2024-06-11 DIAGNOSIS — Z0000 Encounter for general adult medical examination without abnormal findings: Secondary | ICD-10-CM | POA: Diagnosis not present

## 2024-06-11 DIAGNOSIS — Z1159 Encounter for screening for other viral diseases: Secondary | ICD-10-CM | POA: Diagnosis not present

## 2024-06-11 DIAGNOSIS — C4491 Basal cell carcinoma of skin, unspecified: Secondary | ICD-10-CM | POA: Diagnosis not present

## 2024-06-11 DIAGNOSIS — Z23 Encounter for immunization: Secondary | ICD-10-CM | POA: Diagnosis not present

## 2024-06-11 DIAGNOSIS — M4125 Other idiopathic scoliosis, thoracolumbar region: Secondary | ICD-10-CM | POA: Diagnosis not present

## 2024-06-17 DIAGNOSIS — I2510 Atherosclerotic heart disease of native coronary artery without angina pectoris: Secondary | ICD-10-CM | POA: Diagnosis not present

## 2024-06-17 DIAGNOSIS — M19049 Primary osteoarthritis, unspecified hand: Secondary | ICD-10-CM | POA: Diagnosis not present

## 2024-06-17 DIAGNOSIS — E7800 Pure hypercholesterolemia, unspecified: Secondary | ICD-10-CM | POA: Diagnosis not present

## 2024-06-17 DIAGNOSIS — J4520 Mild intermittent asthma, uncomplicated: Secondary | ICD-10-CM | POA: Diagnosis not present

## 2024-06-23 DIAGNOSIS — M720 Palmar fascial fibromatosis [Dupuytren]: Secondary | ICD-10-CM | POA: Diagnosis not present

## 2024-07-02 DIAGNOSIS — R590 Localized enlarged lymph nodes: Secondary | ICD-10-CM | POA: Diagnosis not present

## 2024-07-02 DIAGNOSIS — J029 Acute pharyngitis, unspecified: Secondary | ICD-10-CM | POA: Diagnosis not present

## 2024-07-02 DIAGNOSIS — J039 Acute tonsillitis, unspecified: Secondary | ICD-10-CM | POA: Diagnosis not present

## 2024-07-08 DIAGNOSIS — M546 Pain in thoracic spine: Secondary | ICD-10-CM | POA: Diagnosis not present

## 2024-07-11 DIAGNOSIS — M8589 Other specified disorders of bone density and structure, multiple sites: Secondary | ICD-10-CM | POA: Diagnosis not present

## 2024-07-11 DIAGNOSIS — Z780 Asymptomatic menopausal state: Secondary | ICD-10-CM | POA: Diagnosis not present

## 2024-07-14 DIAGNOSIS — M545 Low back pain, unspecified: Secondary | ICD-10-CM | POA: Diagnosis not present
